# Patient Record
Sex: Male | Born: 1940 | Race: Black or African American | Hispanic: No | Marital: Married | Smoking: Former smoker
Health system: Southern US, Community
[De-identification: ages and names within clinical notes are randomized; demographics above are authoritative.]

## PROBLEM LIST (undated history)

## (undated) DIAGNOSIS — I10 Essential (primary) hypertension: Secondary | ICD-10-CM

## (undated) DIAGNOSIS — M069 Rheumatoid arthritis, unspecified: Secondary | ICD-10-CM

## (undated) DIAGNOSIS — E119 Type 2 diabetes mellitus without complications: Secondary | ICD-10-CM

## (undated) DIAGNOSIS — Q600 Renal agenesis, unilateral: Secondary | ICD-10-CM

## (undated) HISTORY — PX: NO PAST SURGERIES: SHX2092

---

## 2005-08-19 ENCOUNTER — Ambulatory Visit: Payer: Self-pay | Admitting: Unknown Physician Specialty

## 2008-08-09 ENCOUNTER — Emergency Department: Payer: Self-pay | Admitting: Internal Medicine

## 2010-10-08 ENCOUNTER — Ambulatory Visit: Payer: Self-pay | Admitting: Unknown Physician Specialty

## 2010-12-10 ENCOUNTER — Encounter: Payer: Self-pay | Admitting: Orthopedic Surgery

## 2010-12-15 ENCOUNTER — Encounter: Payer: Self-pay | Admitting: Orthopedic Surgery

## 2011-01-15 ENCOUNTER — Encounter: Payer: Self-pay | Admitting: Orthopedic Surgery

## 2011-02-05 ENCOUNTER — Ambulatory Visit: Payer: Self-pay | Admitting: Gastroenterology

## 2011-02-07 LAB — PATHOLOGY REPORT

## 2011-02-15 ENCOUNTER — Encounter: Payer: Self-pay | Admitting: Orthopedic Surgery

## 2011-03-04 ENCOUNTER — Other Ambulatory Visit: Payer: Self-pay | Admitting: Gastroenterology

## 2011-03-04 DIAGNOSIS — Q438 Other specified congenital malformations of intestine: Secondary | ICD-10-CM

## 2011-03-04 DIAGNOSIS — K5730 Diverticulosis of large intestine without perforation or abscess without bleeding: Secondary | ICD-10-CM

## 2011-03-04 DIAGNOSIS — D126 Benign neoplasm of colon, unspecified: Secondary | ICD-10-CM

## 2011-03-12 ENCOUNTER — Inpatient Hospital Stay: Admission: RE | Admit: 2011-03-12 | Payer: Self-pay | Source: Ambulatory Visit

## 2011-03-18 ENCOUNTER — Other Ambulatory Visit: Payer: Self-pay | Admitting: Gastroenterology

## 2011-03-18 ENCOUNTER — Ambulatory Visit
Admission: RE | Admit: 2011-03-18 | Discharge: 2011-03-18 | Disposition: A | Discharge status: Home / Self Care | Payer: Medicare Other | Source: Ambulatory Visit | Attending: Gastroenterology | Admitting: Gastroenterology

## 2011-03-18 DIAGNOSIS — K5730 Diverticulosis of large intestine without perforation or abscess without bleeding: Secondary | ICD-10-CM

## 2011-03-18 DIAGNOSIS — D126 Benign neoplasm of colon, unspecified: Secondary | ICD-10-CM

## 2011-03-18 DIAGNOSIS — Q438 Other specified congenital malformations of intestine: Secondary | ICD-10-CM

## 2013-08-08 IMAGING — CT CT VIRTUAL COLONOSCOPY SCREENING
3 of 11 series · 11 of 36 positions shown, 16 images · non-contrast
Comparison: None

CLINICAL DATA: Diverticulosis

CT VIRTUAL COLONOSCOPY FOR SCREENING
TECHNIQUE: The patient was given a standard low so bowel
preparation with Gastrografin and barium for fluid and stool
tagging respectively.  The quality of the bowel preparation is
moderate to poor.  Automated CO2 insufflation of the colon was
performed prior to image acquisition and colonic distention is
moderate to poor.  Image post processing was used to generate a 3D
endoluminal fly-through projection of the colon and to
electronically subtract stool/fluid as appropriate.

[Series 2: supine (id) · axial · 0.98mm/px · z∈[-420,-153]mm · 3 of 430 slices shown]
[im 108/430  soft-tissue]
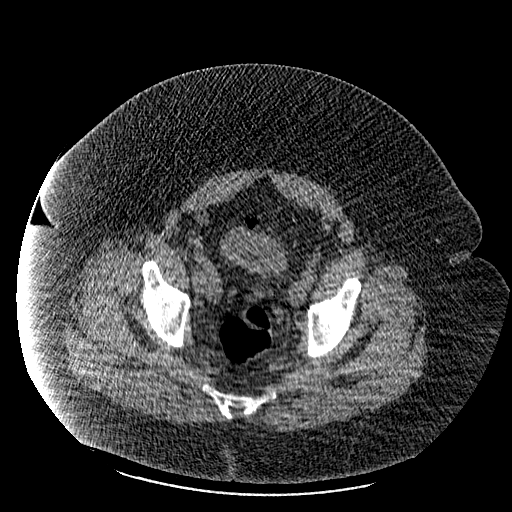
[im 215/430  soft-tissue]
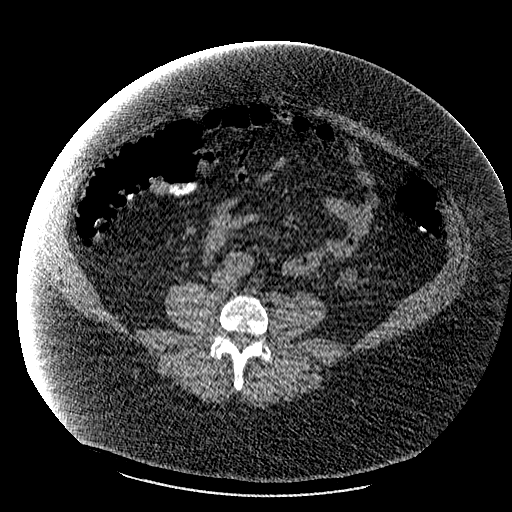
[im 322/430  soft-tissue]
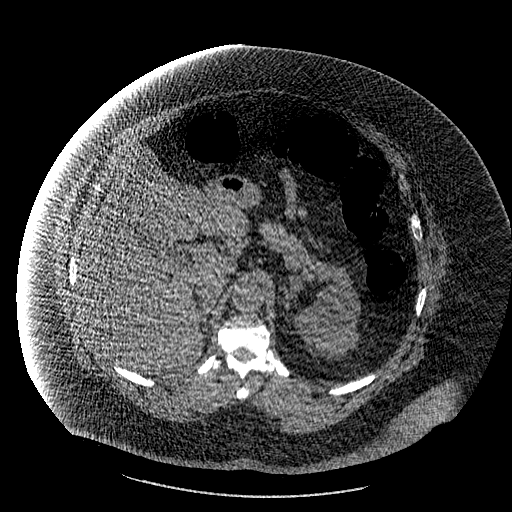

[Series 2: rt decub · axial · 0.98mm/px · z∈[-443,-113]mm · 4 of 441 slices shown, 9 images]
[im 89/441  soft-tissue]
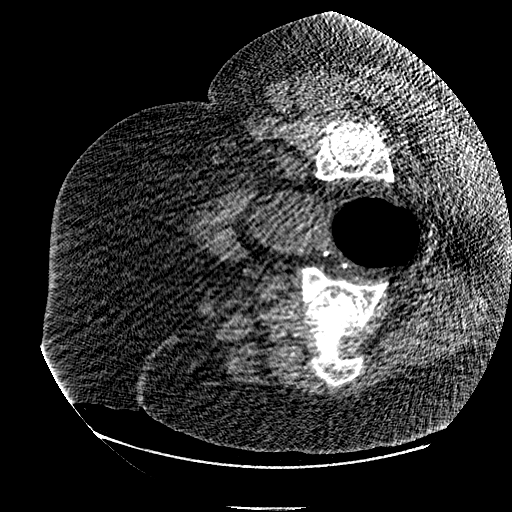
[im 89/441  lung]
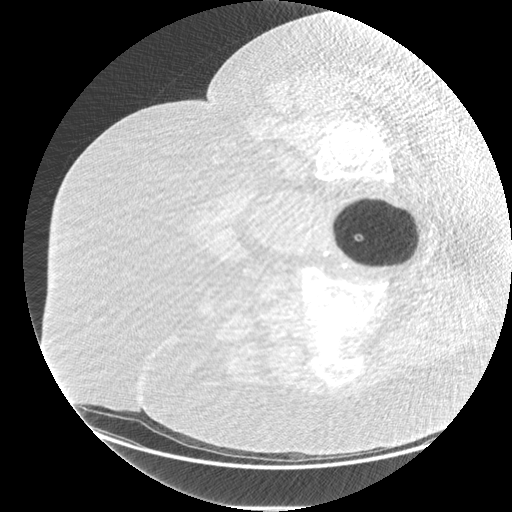
[im 89/441  bone]
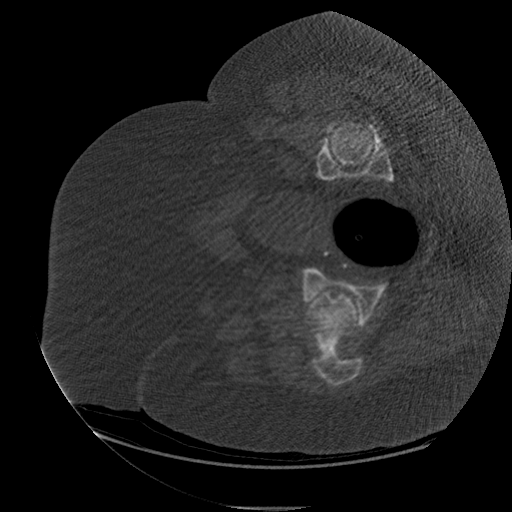
[im 177/441  soft-tissue]
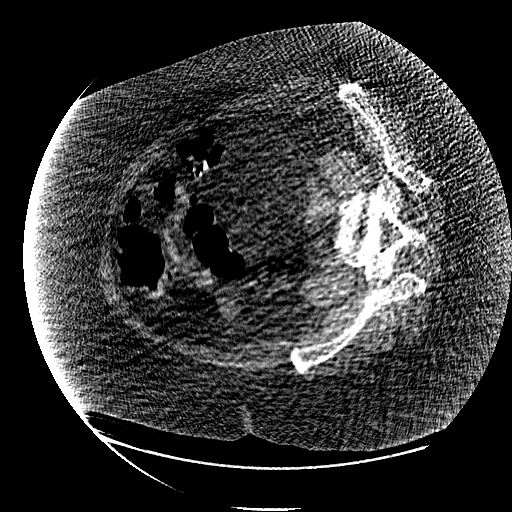
[im 177/441  lung]
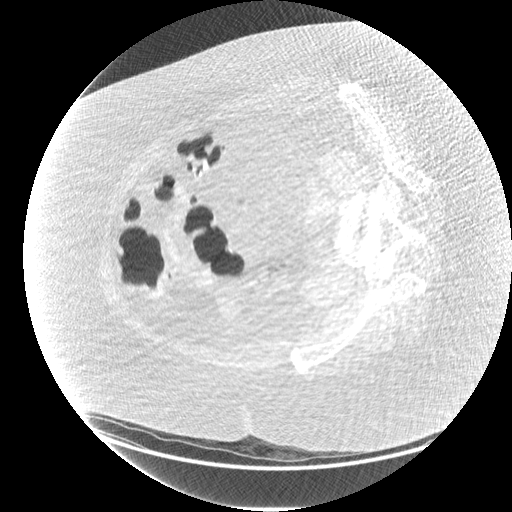
[im 265/441  soft-tissue]
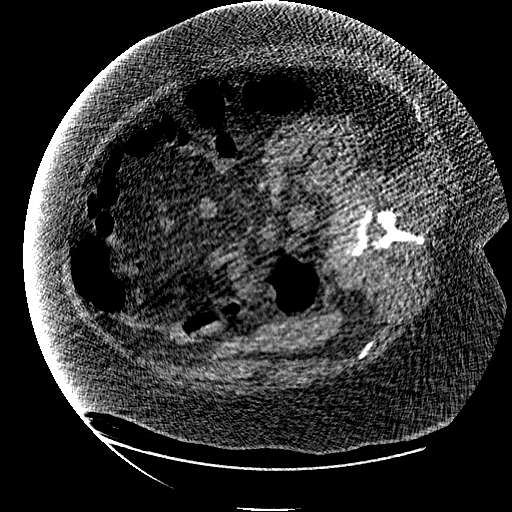
[im 265/441  lung]
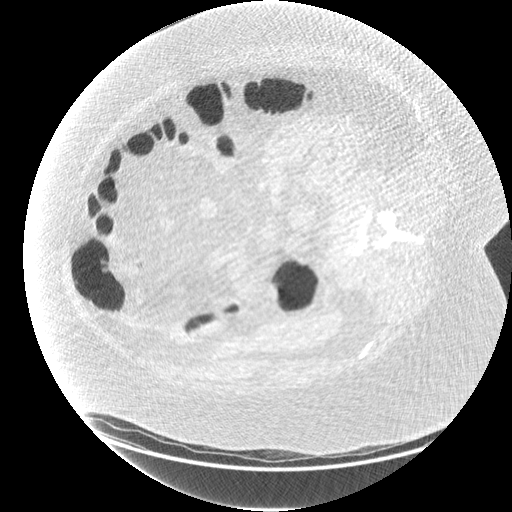
[im 353/441  soft-tissue]
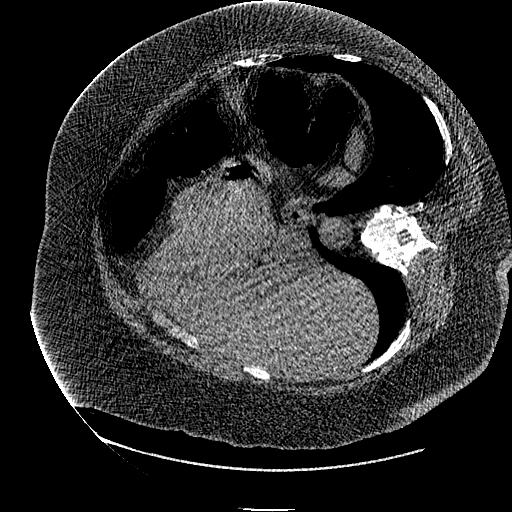
[im 353/441  lung]
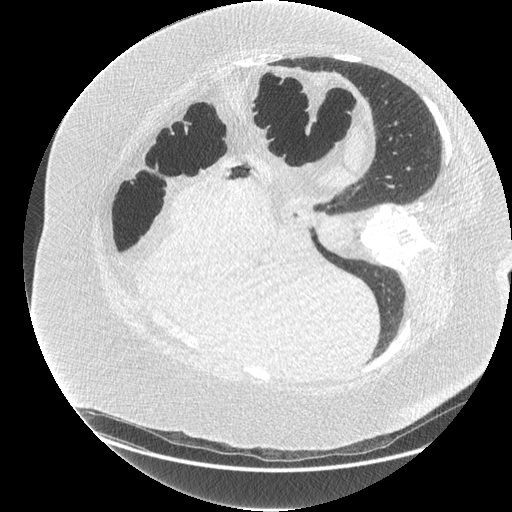

[Series 6: prone (id) · axial · 0.98mm/px · z∈[-470,-132]mm · 4 of 450 slices shown]
[im 90/450  soft-tissue]
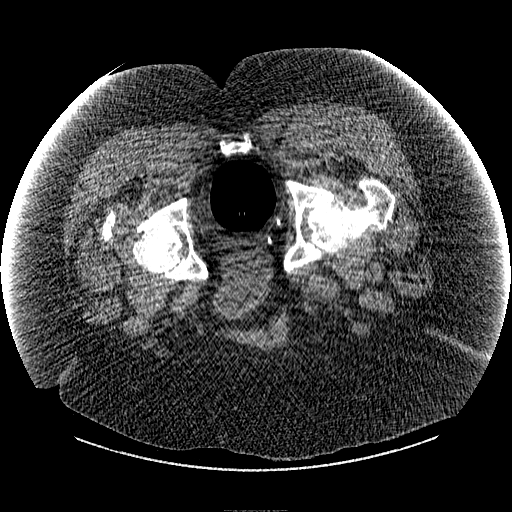
[im 180/450  soft-tissue]
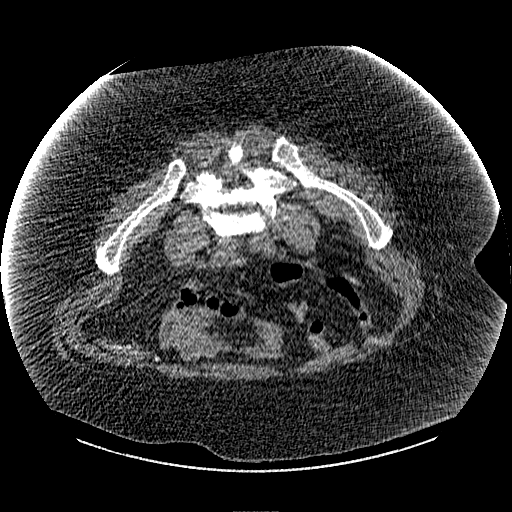
[im 270/450  soft-tissue]
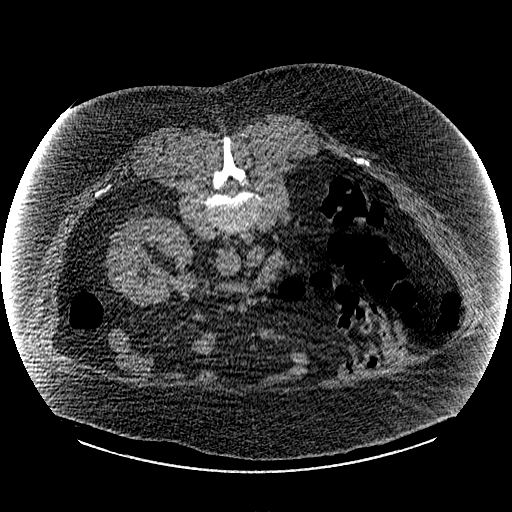
[im 360/450  soft-tissue]
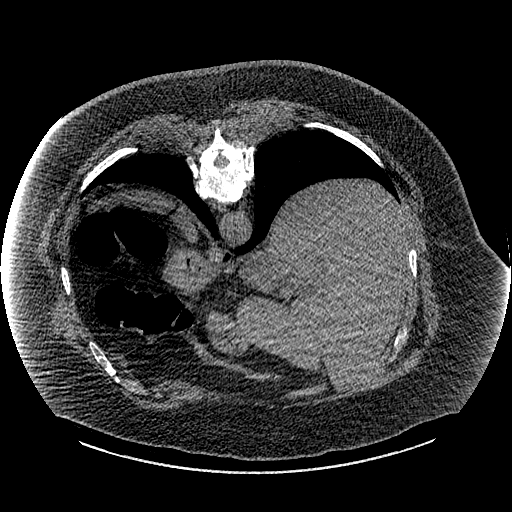

[11 of 36 positions shown; findings below may reference images not displayed]

FINDINGS: Initially the standard supine and prone series were
performed.  However particularly on the prone series there is poor
distention of many areas of the colon.  Therefore an additional
series was performed with the patient in the right lateral
decubitus position.  There is scattered feces and fluid throughout
the colon making assessment more difficult, but no clinically
significant polypoid lesion is identified.  There are areas of the
colon that are extremely difficult to evaluate due to poor
distention particularly the rectosigmoid colon and portions of the
descending colon.  However on both 2-D and 3-D images, no mass is
evident.  Multiple diverticula are present within the rectosigmoid
colon.
IMPRESSION: Suboptimal study due to diverticulosis of the rectosigmoid colon
and incomplete distention of several areas of the colon.  No
clinically significant polypoid lesion or mass is detected however.

Virtual colonoscopy is not designed to detect diminutive polyps
(i.e., less than or equal to 5 mm), the presence or absence of
which may not affect clinical management.

CT ABDOMEN AND PELVIS WITHOUT CONTRAST
FINDINGS: The lung bases are clear.  The liver is unremarkable in
the unenhanced state.  No calcified gallstones are seen.  The
pancreas is normal in size and the pancreatic duct is not dilated.
The adrenal glands are unremarkable.  The left kidney appears
compensatorily hypertrophied with several low attenuation
structures consistent with cysts, but difficult to assess on this
unenhanced study.  The right kidney appears to be markedly
atrophic.  The abdominal aorta is normal in caliber.  No adenopathy
is seen.

Severe diverticulosis of the rectosigmoid colon is noted.  The
urinary bladder is decompressed.  No pelvic mass or fluid is seen.
Degenerative changes are noted particularly throughout the facet
joints of the lower lumbar spine with degenerative disc disease
noted at L4-5 and L5-S1 as well.
IMPRESSION: 1.  Severe diverticulosis of the rectosigmoid colon.
2.  Atrophy of the right kidney with compensatory hypertrophy of
the left kidney.  Probable left renal cysts are noted.
3.  Degenerative change in the lower lumbar spine.

## 2015-02-01 ENCOUNTER — Emergency Department: Payer: Commercial Managed Care - HMO

## 2015-02-01 ENCOUNTER — Inpatient Hospital Stay
Admission: EM | Admit: 2015-02-01 | Discharge: 2015-02-13 | DRG: 871 | Disposition: A | Discharge status: Skilled Nursing Facility | Payer: Commercial Managed Care - HMO | Attending: Internal Medicine | Admitting: Internal Medicine

## 2015-02-01 ENCOUNTER — Encounter: Payer: Self-pay | Admitting: Emergency Medicine

## 2015-02-01 DIAGNOSIS — Z79899 Other long term (current) drug therapy: Secondary | ICD-10-CM | POA: Diagnosis not present

## 2015-02-01 DIAGNOSIS — Z7982 Long term (current) use of aspirin: Secondary | ICD-10-CM | POA: Diagnosis not present

## 2015-02-01 DIAGNOSIS — N183 Chronic kidney disease, stage 3 (moderate): Secondary | ICD-10-CM | POA: Diagnosis present

## 2015-02-01 DIAGNOSIS — Z794 Long term (current) use of insulin: Secondary | ICD-10-CM | POA: Diagnosis not present

## 2015-02-01 DIAGNOSIS — D638 Anemia in other chronic diseases classified elsewhere: Secondary | ICD-10-CM | POA: Diagnosis present

## 2015-02-01 DIAGNOSIS — N186 End stage renal disease: Secondary | ICD-10-CM

## 2015-02-01 DIAGNOSIS — E1122 Type 2 diabetes mellitus with diabetic chronic kidney disease: Secondary | ICD-10-CM | POA: Diagnosis present

## 2015-02-01 DIAGNOSIS — E871 Hypo-osmolality and hyponatremia: Secondary | ICD-10-CM | POA: Diagnosis present

## 2015-02-01 DIAGNOSIS — N170 Acute kidney failure with tubular necrosis: Secondary | ICD-10-CM | POA: Diagnosis present

## 2015-02-01 DIAGNOSIS — M069 Rheumatoid arthritis, unspecified: Secondary | ICD-10-CM | POA: Diagnosis present

## 2015-02-01 DIAGNOSIS — Q600 Renal agenesis, unilateral: Secondary | ICD-10-CM | POA: Diagnosis not present

## 2015-02-01 DIAGNOSIS — A419 Sepsis, unspecified organism: Secondary | ICD-10-CM | POA: Diagnosis present

## 2015-02-01 DIAGNOSIS — R6521 Severe sepsis with septic shock: Secondary | ICD-10-CM | POA: Diagnosis present

## 2015-02-01 DIAGNOSIS — Z6841 Body Mass Index (BMI) 40.0 and over, adult: Secondary | ICD-10-CM | POA: Diagnosis not present

## 2015-02-01 DIAGNOSIS — R7989 Other specified abnormal findings of blood chemistry: Secondary | ICD-10-CM

## 2015-02-01 DIAGNOSIS — I959 Hypotension, unspecified: Secondary | ICD-10-CM | POA: Diagnosis present

## 2015-02-01 DIAGNOSIS — R778 Other specified abnormalities of plasma proteins: Secondary | ICD-10-CM

## 2015-02-01 DIAGNOSIS — R21 Rash and other nonspecific skin eruption: Secondary | ICD-10-CM | POA: Diagnosis not present

## 2015-02-01 DIAGNOSIS — R81 Glycosuria: Secondary | ICD-10-CM | POA: Diagnosis present

## 2015-02-01 DIAGNOSIS — N390 Urinary tract infection, site not specified: Secondary | ICD-10-CM | POA: Diagnosis present

## 2015-02-01 DIAGNOSIS — E872 Acidosis: Secondary | ICD-10-CM | POA: Diagnosis present

## 2015-02-01 DIAGNOSIS — I129 Hypertensive chronic kidney disease with stage 1 through stage 4 chronic kidney disease, or unspecified chronic kidney disease: Secondary | ICD-10-CM | POA: Diagnosis present

## 2015-02-01 DIAGNOSIS — Z833 Family history of diabetes mellitus: Secondary | ICD-10-CM | POA: Diagnosis not present

## 2015-02-01 DIAGNOSIS — Z452 Encounter for adjustment and management of vascular access device: Secondary | ICD-10-CM

## 2015-02-01 DIAGNOSIS — Z87891 Personal history of nicotine dependence: Secondary | ICD-10-CM

## 2015-02-01 DIAGNOSIS — N179 Acute kidney failure, unspecified: Secondary | ICD-10-CM

## 2015-02-01 HISTORY — DX: Rheumatoid arthritis, unspecified: M06.9

## 2015-02-01 HISTORY — DX: Essential (primary) hypertension: I10

## 2015-02-01 HISTORY — DX: Renal agenesis, unilateral: Q60.0

## 2015-02-01 HISTORY — DX: Type 2 diabetes mellitus without complications: E11.9

## 2015-02-01 LAB — MRSA PCR SCREENING: MRSA by PCR: NEGATIVE

## 2015-02-01 LAB — BRAIN NATRIURETIC PEPTIDE: B NATRIURETIC PEPTIDE 5: 223 pg/mL — AB (ref 0.0–100.0)

## 2015-02-01 LAB — LACTIC ACID, PLASMA: Lactic Acid, Venous: 5.2 mmol/L (ref 0.5–2.0)

## 2015-02-01 LAB — BASIC METABOLIC PANEL
ANION GAP: 15 (ref 5–15)
BUN: 35 mg/dL — ABNORMAL HIGH (ref 6–20)
CALCIUM: 8.8 mg/dL — AB (ref 8.9–10.3)
CHLORIDE: 97 mmol/L — AB (ref 101–111)
CO2: 22 mmol/L (ref 22–32)
Creatinine, Ser: 3.64 mg/dL — ABNORMAL HIGH (ref 0.61–1.24)
GFR calc non Af Amer: 15 mL/min — ABNORMAL LOW (ref >60)
GFR, EST AFRICAN AMERICAN: 17 mL/min — AB (ref >60)
GLUCOSE: 251 mg/dL — AB (ref 65–99)
POTASSIUM: 4.6 mmol/L (ref 3.5–5.1)
Sodium: 134 mmol/L — ABNORMAL LOW (ref 135–145)

## 2015-02-01 LAB — CBC WITH DIFFERENTIAL/PLATELET
Basophils Absolute: 0.1 10*3/uL (ref 0–0.1)
Eosinophils Absolute: 0.1 10*3/uL (ref 0–0.7)
Eosinophils Relative: 0 %
HEMATOCRIT: 35.6 % — AB (ref 40.0–52.0)
HEMOGLOBIN: 11.5 g/dL — AB (ref 13.0–18.0)
LYMPHS ABS: 0.6 10*3/uL — AB (ref 1.0–3.6)
MCH: 28.9 pg (ref 26.0–34.0)
MCHC: 32.4 g/dL (ref 32.0–36.0)
MCV: 89 fL (ref 80.0–100.0)
MONO ABS: 0.9 10*3/uL (ref 0.2–1.0)
NEUTROS ABS: 25 10*3/uL — AB (ref 1.4–6.5)
Platelets: 148 10*3/uL — ABNORMAL LOW (ref 150–440)
RBC: 4 MIL/uL — ABNORMAL LOW (ref 4.40–5.90)
RDW: 13.9 % (ref 11.5–14.5)
WBC: 26.8 10*3/uL — ABNORMAL HIGH (ref 3.8–10.6)

## 2015-02-01 LAB — GLUCOSE, CAPILLARY: Glucose-Capillary: 150 mg/dL — ABNORMAL HIGH (ref 65–99)

## 2015-02-01 LAB — TROPONIN I: Troponin I: 0.42 ng/mL — ABNORMAL HIGH (ref <0.031)

## 2015-02-01 MED ORDER — INSULIN ASPART 100 UNIT/ML ~~LOC~~ SOLN
0.0000 [IU] | Freq: Every day | SUBCUTANEOUS | Status: DC
  Administered 2015-02-12: 5 [IU] via SUBCUTANEOUS
  Filled 2015-02-01: qty 4

## 2015-02-01 MED ORDER — INSULIN GLARGINE 100 UNIT/ML ~~LOC~~ SOLN
8.0000 [IU] | Freq: Every day | SUBCUTANEOUS | Status: DC
  Administered 2015-02-02 – 2015-02-03 (×3): 8 [IU] via SUBCUTANEOUS
  Filled 2015-02-01 (×6): qty 0.08

## 2015-02-01 MED ORDER — SODIUM CHLORIDE 0.9 % IV BOLUS (SEPSIS)
1000.0000 mL | INTRAVENOUS | Status: AC
  Administered 2015-02-01: 1000 mL via INTRAVENOUS

## 2015-02-01 MED ORDER — INSULIN ASPART 100 UNIT/ML ~~LOC~~ SOLN
0.0000 [IU] | Freq: Three times a day (TID) | SUBCUTANEOUS | Status: DC
  Administered 2015-02-02: 2 [IU] via SUBCUTANEOUS
  Administered 2015-02-02: 1 [IU] via SUBCUTANEOUS
  Administered 2015-02-02 – 2015-02-03 (×4): 2 [IU] via SUBCUTANEOUS
  Administered 2015-02-04 (×2): 1 [IU] via SUBCUTANEOUS
  Administered 2015-02-04 – 2015-02-05 (×3): 2 [IU] via SUBCUTANEOUS
  Administered 2015-02-06 (×2): 1 [IU] via SUBCUTANEOUS
  Administered 2015-02-06 – 2015-02-07 (×2): 2 [IU] via SUBCUTANEOUS
  Administered 2015-02-07 (×2): 1 [IU] via SUBCUTANEOUS
  Administered 2015-02-08 (×2): 2 [IU] via SUBCUTANEOUS
  Administered 2015-02-08: 1 [IU] via SUBCUTANEOUS
  Administered 2015-02-09: 2 [IU] via SUBCUTANEOUS
  Administered 2015-02-09: 3 [IU] via SUBCUTANEOUS
  Administered 2015-02-10: 1 [IU] via SUBCUTANEOUS
  Administered 2015-02-10: 2 [IU] via SUBCUTANEOUS
  Administered 2015-02-11: 3 [IU] via SUBCUTANEOUS
  Administered 2015-02-11: 2 [IU] via SUBCUTANEOUS
  Administered 2015-02-12: 5 [IU] via SUBCUTANEOUS
  Administered 2015-02-12: 2 [IU] via SUBCUTANEOUS
  Administered 2015-02-12: 3 [IU] via SUBCUTANEOUS
  Administered 2015-02-13 (×2): 2 [IU] via SUBCUTANEOUS
  Filled 2015-02-01: qty 2
  Filled 2015-02-01: qty 5
  Filled 2015-02-01 (×2): qty 2
  Filled 2015-02-01: qty 1
  Filled 2015-02-01: qty 3
  Filled 2015-02-01 (×2): qty 1
  Filled 2015-02-01: qty 3
  Filled 2015-02-01: qty 1
  Filled 2015-02-01 (×4): qty 2
  Filled 2015-02-01: qty 1
  Filled 2015-02-01 (×3): qty 2
  Filled 2015-02-01: qty 1
  Filled 2015-02-01: qty 3
  Filled 2015-02-01 (×3): qty 2
  Filled 2015-02-01: qty 1
  Filled 2015-02-01 (×3): qty 2
  Filled 2015-02-01 (×2): qty 1
  Filled 2015-02-01: qty 2

## 2015-02-01 MED ORDER — ACETAMINOPHEN 325 MG PO TABS
650.0000 mg | ORAL_TABLET | Freq: Four times a day (QID) | ORAL | Status: DC | PRN
Start: 2015-02-01 — End: 2015-02-13

## 2015-02-01 MED ORDER — ASPIRIN 325 MG PO TABS
325.0000 mg | ORAL_TABLET | Freq: Every day | ORAL | Status: DC
  Administered 2015-02-02 – 2015-02-13 (×13): 325 mg via ORAL
  Filled 2015-02-01 (×14): qty 1

## 2015-02-01 MED ORDER — SODIUM CHLORIDE 0.9 % IV SOLN
INTRAVENOUS | Status: DC
  Administered 2015-02-01 – 2015-02-02 (×2): via INTRAVENOUS
  Administered 2015-02-03: 17:00:00 75 mL/h via INTRAVENOUS
  Administered 2015-02-03 – 2015-02-04 (×2): via INTRAVENOUS

## 2015-02-01 MED ORDER — ACETAMINOPHEN 650 MG RE SUPP: 650.0000 mg | Freq: Four times a day (QID) | RECTAL | Status: DC | PRN

## 2015-02-01 MED ORDER — SODIUM CHLORIDE 0.9 % IV BOLUS (SEPSIS)
1000.0000 mL | Freq: Once | INTRAVENOUS | Status: AC
  Administered 2015-02-01: 1000 mL via INTRAVENOUS

## 2015-02-01 MED ORDER — SODIUM CHLORIDE 0.9 % IV BOLUS (SEPSIS): 1000.0000 mL | INTRAVENOUS | Status: AC

## 2015-02-01 MED ORDER — SODIUM CHLORIDE 0.9 % IJ SOLN
3.0000 mL | Freq: Two times a day (BID) | INTRAMUSCULAR | Status: DC
  Administered 2015-02-02 – 2015-02-11 (×14): 3 mL via INTRAVENOUS

## 2015-02-01 MED ORDER — HEPARIN SODIUM (PORCINE) 5000 UNIT/ML IJ SOLN
5000.0000 [IU] | Freq: Three times a day (TID) | INTRAMUSCULAR | Status: DC
  Administered 2015-02-02 – 2015-02-13 (×31): 5000 [IU] via SUBCUTANEOUS
  Filled 2015-02-01 (×32): qty 1

## 2015-02-01 MED ORDER — PIPERACILLIN-TAZOBACTAM 3.375 G IVPB
3.3750 g | Freq: Once | INTRAVENOUS | Status: AC
  Administered 2015-02-01: 3.375 g via INTRAVENOUS
  Filled 2015-02-01: qty 50

## 2015-02-01 NOTE — ED Provider Notes (Addendum)
[facility] Emergency Department Provider Note  ____________________________________________  Time seen: 16:20  I have reviewed the triage vital signs and the nursing notes.  History by:    HISTORY  Chief Complaint Shortness of Breath     HPI Dakota Estes is a 75 y.o. male who reports he began having shortness of breath this morning at 7 AM. He reports feeling cold through most of the night. He had some mild chest pressure tonight but denies any chest pressure or pain currently.     Past Medical History  Diagnosis Date  . Diabetes mellitus without complication ([place])   . Congenital absence of one kidney     There are no active problems to display for this patient.   History reviewed. No pertinent past surgical history.  Current Outpatient Rx  Name  Route  Sig  Dispense  Refill  . aspirin 325 MG tablet   Oral   Take 325 mg by mouth daily.         Marland Kitchen doxazosin (CARDURA) 4 MG tablet   Oral   Take 4 mg by mouth at bedtime.         . insulin lispro (HUMALOG) 100 UNIT/ML injection   Subcutaneous   Inject 40-46 Units into the skin 2 (two) times daily. Pt uses 46 units in the morning and 40 at night.         . insulin NPH Human (HUMULIN N,NOVOLIN N) 100 UNIT/ML injection   Subcutaneous   Inject 60 Units into the skin 2 (two) times daily before a meal.         . ramipril (ALTACE) 10 MG capsule   Oral   Take 10 mg by mouth daily.           Allergies Review of patient's allergies indicates no known allergies.  History reviewed. No pertinent family history.  Social History Social History  Substance Use Topics  . Smoking status: Former Smoker    Types: Cigarettes    Quit date: 02/01/1983  . Smokeless tobacco: None  . Alcohol Use: No    Review of Systems  Constitutional: Positive for chills, feeling cold ENT: Negative for congestion. Cardiovascular: Some chest tightness last night. She history of present illness   Respiratory: Shortness of breath.. Gastrointestinal: Negative for abdominal pain, vomiting and diarrhea. Genitourinary: Negative for dysuria. Musculoskeletal: Chronic edema.. Skin: Negative for rash. Neurological: Negative for headache or focal weakness   10-point ROS otherwise negative.  ____________________________________________   PHYSICAL EXAM:  VITAL SIGNS: ED Triage Vitals  Enc Vitals Group     BP 02/01/15 1419 99/50 mmHg     Pulse Rate 02/01/15 1419 113     Resp 02/01/15 1419 24     Temp 02/01/15 1419 98.6 F (37 C)     Temp Source 02/01/15 1419 Oral     SpO2 02/01/15 1419 96 %     Weight 02/01/15 1419 353 lb (160.12 kg)     Height 02/01/15 1419 6\' 5"  (1.956 m)     Head Cir --      Peak Flow --      Pain Score 02/01/15 1420 0     Pain Loc --      Pain Edu? --      Excl. in [place]? --     Constitutional: Alert and oriented. No distress. ENT   Head: Normocephalic and atraumatic.   Nose: No congestion/rhinnorhea.       Mouth: No erythema, no swelling  Cardiovascular: Normal rate at 96, regular rhythm, no murmur noted Respiratory:  Normal respiratory effort, no tachypnea.    Breath sounds are clear and equal bilaterally.  Gastrointestinal: Soft, no distention. Nontender Back: No muscle spasm, no tenderness, no CVA tenderness. Musculoskeletal: No deformity noted. Nontender with normal range of motion in all extremities. Patient with Neurologic:  Communicative. Normal appearing spontaneous movement in all 4 extremities. No gross focal neurologic deficits are appreciated.  Skin:  Skin is warm, dry. No rash noted. Psychiatric: Mood and affect are normal. Speech and behavior are normal.  ____________________________________________    LABS (pertinent positives/negatives)  Labs Reviewed  BASIC METABOLIC PANEL - Abnormal; Notable for the following:    Sodium 134 (*)    Chloride 97 (*)    Glucose, Bld 251 (*)    BUN 35 (*)    Creatinine, Ser 3.64 (*)     Calcium 8.8 (*)    GFR calc non Af Amer 15 (*)    GFR calc Af Amer 17 (*)    All other components within normal limits  CBC WITH DIFFERENTIAL/PLATELET - Abnormal; Notable for the following:    WBC 26.8 (*)    RBC 4.00 (*)    Hemoglobin 11.5 (*)    HCT 35.6 (*)    Platelets 148 (*)    Neutro Abs 25.0 (*)    Lymphs Abs 0.6 (*)    All other components within normal limits  TROPONIN I - Abnormal; Notable for the following:    Troponin I 0.42 (*)    All other components within normal limits  BRAIN NATRIURETIC PEPTIDE - Abnormal; Notable for the following:    B Natriuretic Peptide 223.0 (*)    All other components within normal limits  LACTIC ACID, PLASMA - Abnormal; Notable for the following:    Lactic Acid, Venous 5.2 (*)    All other components within normal limits  URINE CULTURE  CULTURE, BLOOD (ROUTINE X 2)  CULTURE, BLOOD (ROUTINE X 2)  URINALYSIS COMPLETEWITH MICROSCOPIC ([facility] ONLY)  LACTIC ACID, PLASMA     ____________________________________________   EKG  ED ECG REPORT I, Asante Blanda W, the attending physician, personally viewed and interpreted this ECG.   Date: 02/01/2015  EKG Time: 1425  Rate: 111  Rhythm: Sinus tachycardia  Axis: Normal  Intervals: Normal  ST&T Change: None noted   ____________________________________________    RADIOLOGY  Chest x-ray  FINDINGS: Studies limb about the patient's size. The lungs appear clear. Heart size is normal. No pneumothorax or pleural effusion.  IMPRESSION: No acute disease.  ____________________________________________   PROCEDURES  Code sepsis  CRITICAL CARE Performed by: Ahmed Prima   Total critical care time: 30 minutes  Critical care time was exclusive of separately billable procedures and treating other patients.  Critical care was necessary to treat or prevent imminent or life-threatening deterioration.  Critical care was time spent personally by me on the following activities:  development of treatment plan with patient and/or surrogate as well as nursing, discussions with consultants, evaluation of patient's response to treatment, examination of patient, obtaining history from patient or surrogate, ordering and performing treatments and interventions, ordering and review of laboratory studies, ordering and review of radiographic studies, pulse oximetry and re-evaluation of patient's condition.   ____________________________________________   INITIAL IMPRESSION / ASSESSMENT AND PLAN / ED COURSE  Pertinent labs & imaging results that were available during my care of the patient were reviewed by me and considered in my medical decision making (see chart for details).  75 year old male with some shortness of breath. He reports he has some chills. He slightly tachycardic and he has borderline blood pressure. We will order basic labs as well as a lactic acid. Chest x-ray is pending.  ----------------------------------------- 4:22 PM on 02/01/2015 -----------------------------------------  Chest x-ray is unremarkable. No acute change  ----------------------------------------- 6:22 PM on 02/01/2015 -----------------------------------------  I just been informed that the lactic acid level is 5.2. We will add blood cultures and begin antibiotics, though it is unclear what may be a source for infection.  ----------------------------------------- 6:48 PM on 02/01/2015 -----------------------------------------  Reexamination patient finds him alert and communicative and in no acute distress. He does report having some additional chills. His white blood cell count is 26.8. His renal function is diminished with a BUN of 35 and creatinine of 3.64.  This renal dysfunction is new compared to prior results.  I initiated a code sepsis for the patient. His receiving additional IV fluids. I discussed admission with Dr. Volanda Napoleon of the hospitalist  service.  ----------------------------------------- 7:24 PM on 02/01/2015 -----------------------------------------  Troponin is elevated 0.42  ____________________________________________   FINAL CLINICAL IMPRESSION(S) / ED DIAGNOSES  Final diagnoses:  Sepsis, due to unspecified organism [facility])  Acute renal failure, unspecified acute renal failure type ([place])  Troponin level elevated      Ahmed Prima, MD 02/01/15 [id number]  Ahmed Prima, MD 02/01/15 [phone]

## 2015-02-01 NOTE — ED Notes (Addendum)
States SOB that began this AM, states not feeling well since Sunday, brother states bloody urine, pt denies any pain

## 2015-02-01 NOTE — H&P (Signed)
[place] [facility] [place] NAME: Dakota Estes    MR#:  [id number]  DATE OF BIRTH:  26-Jan-1940  DATE OF ADMISSION:  02/01/2015  PRIMARY CARE PHYSICIAN: Dr. Mickel Duhamel  REQUESTING/REFERRING PHYSICIAN: Thomasene Lot  CHIEF COMPLAINT:   Chief Complaint  Patient presents with  . Shortness of Breath    HISTORY OF PRESENT ILLNESS:  Dakota Estes  is a 75 y.o. male presents with a shaking chill. This lasted 2 hours last night. He's been feeling very weak. No appetite and not eating for a couple days. 2 days ago he had an abdominal pain that lasted a little while and then went away no abdominal pain currently today. He came to the ER was found to have an elevated white count of 26.8, high lactic acid of 5.2, and acute renal failure and he was also hypotensive. Hospitalist services were contacted for further evaluation.  PAST MEDICAL HISTORY:   Past Medical History  Diagnosis Date  . Diabetes mellitus without complication ([place])   . Congenital absence of one kidney   . Hypertension   . Rheumatoid arthritis ([place])     PAST SURGICAL HISTORY:   Past Surgical History  Procedure Laterality Date  . No past surgeries      SOCIAL HISTORY:   Social History  Substance Use Topics  . Smoking status: Former Smoker    Types: Cigarettes    Quit date: 02/01/1983  . Smokeless tobacco: Not on file  . Alcohol Use: No    FAMILY HISTORY:   Family History  Problem Relation Age of Onset  . Dementia Mother   . Diabetes Father     DRUG ALLERGIES:  No Known Allergies  REVIEW OF SYSTEMS:  CONSTITUTIONAL: No fever, cold and shaking chill. Positive for weakness. Positive for weight loss 30 years. EYES: No blurred or double vision. Wears glasses. EARS, NOSE, AND THROAT: No tinnitus or ear pain. No sore throat RESPIRATORY: No cough, positive for shortness of breath shortness of breath, wheezing or hemoptysis.  CARDIOVASCULAR: No chest pain,  orthopnea, edema.  GASTROINTESTINAL: No nausea, vomiting, diarrhea. 2 days ago had some abdominal pain. No blood in bowel movements. GENITOURINARY: No dysuria, dark urine possible blood ENDOCRINE: No polyuria, nocturia,  HEMATOLOGY: No anemia, easy bruising or bleeding SKIN: No rash or lesion. MUSCULOSKELETAL: No joint pain or arthritis.   NEUROLOGIC: No tingling, numbness, weakness.  PSYCHIATRY: No anxiety or depression.   MEDICATIONS AT HOME:   Prior to Admission medications   Medication Sig Start Date End Date Taking? Authorizing Provider  aspirin 325 MG tablet Take 325 mg by mouth daily.   Yes Historical Provider, MD  doxazosin (CARDURA) 4 MG tablet Take 4 mg by mouth at bedtime.   Yes Historical Provider, MD  insulin lispro (HUMALOG) 100 UNIT/ML injection Inject 40-46 Units into the skin 2 (two) times daily. Pt uses 46 units in the morning and 40 at night.   Yes Historical Provider, MD  insulin NPH Human (HUMULIN N,NOVOLIN N) 100 UNIT/ML injection Inject 60 Units into the skin 2 (two) times daily before a meal.   Yes Historical Provider, MD  ramipril (ALTACE) 10 MG capsule Take 10 mg by mouth daily.   Yes Historical Provider, MD      VITAL SIGNS:  Blood pressure 111/56, pulse 103 respirations 24 pulse ox 95% on room air  PHYSICAL EXAMINATION:  GENERAL:  75 y.o.-year-old patient lying in the bed with no acute distress.  EYES: Pupils equal,  round, reactive to light and accommodation. No scleral icterus. Extraocular muscles intact.  HEENT: Head atraumatic, normocephalic. Oropharynx and nasopharynx clear.  NECK:  Supple, no jugular venous distention. No thyroid enlargement, no tenderness.  LUNGS: Normal breath sounds bilaterally, no wheezing, rales,rhonchi or crepitation. No use of accessory muscles of respiration.  CARDIOVASCULAR: S1, S2 tachycardic. No murmurs, rubs, or gallops.  ABDOMEN: Soft, nontender, nondistended. Bowel sounds present. No organomegaly or mass.  EXTREMITIES:  3+ edema, no cyanosis, or clubbing.  NEUROLOGIC: Cranial nerves II through XII are intact. Sensation intact. Gait not checked.  PSYCHIATRIC: The patient is alert and oriented x 3.  SKIN: No rash, lesion, or ulcer.   LABORATORY PANEL:   CBC  Recent Labs Lab 02/01/15 1709  WBC 26.8*  HGB 11.5*  HCT 35.6*  PLT 148*   ------------------------------------------------------------------------------------------------------------------  Chemistries   Recent Labs Lab 02/01/15 1709  NA 134*  K 4.6  CL 97*  CO2 22  GLUCOSE 251*  BUN 35*  CREATININE 3.64*  CALCIUM 8.8*   ------------------------------------------------------------------------------------------------------------------  Cardiac Enzymes  Recent Labs Lab 02/01/15 1709  TROPONINI 0.42*   ------------------------------------------------------------------------------------------------------------------  RADIOLOGY:  Dg Chest 2 View  02/01/2015  CLINICAL DATA:  Worsening shortness of breath and abdominal pain since 01/29/2015. Initial encounter. EXAM: CHEST  2 VIEW COMPARISON:  CT chest 10/08/2010. FINDINGS: Studies limb about the patient's size. The lungs appear clear. Heart size is normal. No pneumothorax or pleural effusion. IMPRESSION: No acute disease. Electronically Signed   By: Inge Rise M.D.   On: 02/01/2015 15:14    EKG:   Sinus tachycardia 111 bpm  IMPRESSION AND PLAN:   1. Severe sepsis, lactic acidosis, hypotension, tachycardia, leukocytosis and suspected infection. Empiric Zosyn. Follow-up blood cultures. When the patient urinates get a urine analysis and culture. 2. Acute renal failure with one kidney. IV fluid hydration. Likely will need a nephrology consultation. 3. Type 2 diabetes mellitus- will give low-dose Lantus and sliding scale 4. Hypotension- give IV fluid hydration and hold Altace 5. Rheumatoid arthritis 6. Morbid obesity  All the records are reviewed and case discussed with ED  provider. Management plans discussed with the patient, family and they are in agreement.  CODE STATUS: Full code  TOTAL TIME TAKING CARE OF THIS PATIENT: 50 minutes, critical care time.    Loletha Grayer M.D on 02/01/2015 at 8:03 PM  Between 7am to 6pm - Pager - [phone]  After 6pm call admission pager [facility]  Office  [phone]  CC: Primary care physician; Mickel Duhamel

## 2015-02-01 NOTE — ED Notes (Deleted)
States SOB that began today, states since Sunday he has not been feeling well, pt breathing heavy during triage

## 2015-02-01 NOTE — ED Notes (Signed)
MD at bedside. 

## 2015-02-01 NOTE — Progress Notes (Signed)
[facility] Progress Note Patient Name: Dakota Estes DOB: 04-07-1940 MRN: [id number]   Date of Service  02/01/2015  HPI/Events of Note  Sepsis -lactate 5.2 Source ? UTI AKI  eICU Interventions  Sepsis repeat assessment completed Needs rpt lactate     Intervention Category Evaluation Type: New Patient Evaluation  Dakota Virgo V. 02/01/2015, 9:53 PM

## 2015-02-02 ENCOUNTER — Inpatient Hospital Stay: Payer: Commercial Managed Care - HMO

## 2015-02-02 LAB — URINALYSIS COMPLETE WITH MICROSCOPIC (ARMC ONLY)
GLUCOSE, UA: 50 mg/dL — AB
KETONES UR: NEGATIVE mg/dL
Nitrite: NEGATIVE
Protein, ur: 100 mg/dL — AB
SPECIFIC GRAVITY, URINE: 1.018 (ref 1.005–1.030)
pH: 5 (ref 5.0–8.0)

## 2015-02-02 LAB — LACTIC ACID, PLASMA
LACTIC ACID, VENOUS: 1.6 mmol/L (ref 0.5–2.0)
Lactic Acid, Venous: 2 mmol/L (ref 0.5–2.0)

## 2015-02-02 LAB — BASIC METABOLIC PANEL
Anion gap: 12 (ref 5–15)
BUN: 38 mg/dL — AB (ref 6–20)
CALCIUM: 8.1 mg/dL — AB (ref 8.9–10.3)
CHLORIDE: 102 mmol/L (ref 101–111)
CO2: 24 mmol/L (ref 22–32)
CREATININE: 3.84 mg/dL — AB (ref 0.61–1.24)
GFR, EST AFRICAN AMERICAN: 16 mL/min — AB (ref >60)
GFR, EST NON AFRICAN AMERICAN: 14 mL/min — AB (ref >60)
Glucose, Bld: 86 mg/dL (ref 65–99)
Potassium: 3.9 mmol/L (ref 3.5–5.1)
SODIUM: 138 mmol/L (ref 135–145)

## 2015-02-02 LAB — GLUCOSE, CAPILLARY
GLUCOSE-CAPILLARY: 130 mg/dL — AB (ref 65–99)
GLUCOSE-CAPILLARY: 169 mg/dL — AB (ref 65–99)
GLUCOSE-CAPILLARY: 153 mg/dL — AB (ref 65–99)
Glucose-Capillary: 161 mg/dL — ABNORMAL HIGH (ref 65–99)

## 2015-02-02 LAB — CBC
HCT: 34.1 % — ABNORMAL LOW (ref 40.0–52.0)
Hemoglobin: 11.1 g/dL — ABNORMAL LOW (ref 13.0–18.0)
MCH: 29.2 pg (ref 26.0–34.0)
MCHC: 32.4 g/dL (ref 32.0–36.0)
MCV: 90.1 fL (ref 80.0–100.0)
PLATELETS: 128 10*3/uL — AB (ref 150–440)
RBC: 3.79 MIL/uL — AB (ref 4.40–5.90)
RDW: 14.1 % (ref 11.5–14.5)
WBC: 19.5 10*3/uL — AB (ref 3.8–10.6)

## 2015-02-02 LAB — IRON AND TIBC
IRON: 21 ug/dL — AB (ref 45–182)
Saturation Ratios: 8 % — ABNORMAL LOW (ref 17.9–39.5)
TIBC: 267 ug/dL (ref 250–450)
UIBC: 246 ug/dL

## 2015-02-02 LAB — TROPONIN I: Troponin I: 0.52 ng/mL — ABNORMAL HIGH (ref <0.031)

## 2015-02-02 LAB — FERRITIN: FERRITIN: 711 ng/mL — AB (ref 24–336)

## 2015-02-02 MED ORDER — PIPERACILLIN-TAZOBACTAM 4.5 G IVPB
4.5000 g | Freq: Three times a day (TID) | INTRAVENOUS | Status: DC
  Administered 2015-02-02 – 2015-02-03 (×3): 4.5 g via INTRAVENOUS
  Filled 2015-02-02 (×6): qty 100

## 2015-02-02 NOTE — Plan of Care (Signed)
Per report from ICU - Pt Urinalysis and urine culture sample has already been sent to lab.

## 2015-02-02 NOTE — Progress Notes (Signed)
Pt tx 1C per MD order, pt tol well, pt verbalized understanding of tx, report called to receiving Rn, all questions answered

## 2015-02-02 NOTE — Progress Notes (Signed)
ANTIBIOTIC CONSULT NOTE - INITIAL  Pharmacy Consult for Zosyn Indication: rule out sepsis  No Known Allergies  Patient Measurements: Height: 6\' 5"  (195.6 cm) Weight: (!) 431 lb 14.1 oz (195.9 kg) IBW/kg (Calculated) : 89.1   Vital Signs: Temp: 98.9 F (37.2 C) (01/19 0810) Temp Source: Oral (01/19 0810) BP: 109/64 mmHg (01/19 0700) Pulse Rate: 96 (01/19 0700) Intake/Output from previous day: 01/18 0701 - 01/19 0700 In: 1452.9 [P.O.:480; I.V.:972.9] Out: 200 [Urine:200] Intake/Output from this shift: Total I/O In: 960 [P.O.:360; I.V.:500; IV Piggyback:100] Out: 800 [Urine:800]  Labs:  Recent Labs  02/01/15 1709 02/02/15 0211  WBC 26.8* 19.5*  HGB 11.5* 11.1*  PLT 148* 128*  CREATININE 3.64* 3.84*   Estimated Creatinine Clearance: 31 mL/min (by C-G formula based on Cr of 3.84). No results for input(s): VANCOTROUGH, VANCOPEAK, VANCORANDOM, GENTTROUGH, GENTPEAK, GENTRANDOM, TOBRATROUGH, TOBRAPEAK, TOBRARND, AMIKACINPEAK, AMIKACINTROU, AMIKACIN in the last 72 hours.   Microbiology: Recent Results (from the past 720 hour(s))  MRSA PCR Screening     Status: None   Collection Time: 02/01/15  9:37 PM  Result Value Ref Range Status   MRSA by PCR NEGATIVE NEGATIVE Final    Comment:        The GeneXpert MRSA Assay (FDA approved for NASAL specimens only), is one component of a comprehensive MRSA colonization surveillance program. It is not intended to diagnose MRSA infection nor to guide or monitor treatment for MRSA infections.     Medical History: Past Medical History  Diagnosis Date  . Diabetes mellitus without complication ([place])   . Congenital absence of one kidney   . Hypertension   . Rheumatoid arthritis ([facility])     Medications:  Scheduled:  . aspirin  325 mg Oral Daily  . heparin  5,000 Units Subcutaneous 3 times per day  . insulin aspart  0-5 Units Subcutaneous QHS  . insulin aspart  0-9 Units Subcutaneous TID WC  . insulin glargine  8 Units  Subcutaneous QHS  . piperacillin-tazobactam  4.5 g Intravenous Q8H  . sodium chloride  3 mL Intravenous Q12H   Infusions:  . sodium chloride 125 mL/hr at 02/02/15 0700   Assessment: 75 y/o M admitted with severe sepsis and ARF.   Plan:  Zosyn 3.375 g iv once given in ED. Will continue Zosyn 4.5 g EI q 8 hours due to weight > 120 kg. Will continue to follow renal function and culture results.   Ulice Dash D 02/02/2015,10:41 AM

## 2015-02-02 NOTE — Progress Notes (Signed)
[place] [facility] NAME: Dakota Estes    MR#:  [id number]  DATE OF BIRTH:  10-12-1940  SUBJECTIVE:  CHIEF COMPLAINT:   Chief Complaint  Patient presents with  . Shortness of Breath   no shortness of breath, nausea or vomiting.  REVIEW OF SYSTEMS:  CONSTITUTIONAL: No fever, but has weakness.  EYES: No blurred or double vision.  EARS, NOSE, AND THROAT: No tinnitus or ear pain.  RESPIRATORY: No cough, shortness of breath, wheezing or hemoptysis.  CARDIOVASCULAR: No chest pain, orthopnea, edema.  GASTROINTESTINAL: No nausea, vomiting, diarrhea or abdominal pain.  GENITOURINARY: No dysuria, hematuria.  ENDOCRINE: No polyuria, nocturia,  HEMATOLOGY: No anemia, easy bruising or bleeding SKIN: No rash or lesion. MUSCULOSKELETAL: No joint pain or arthritis.   NEUROLOGIC: No tingling, numbness, weakness.  PSYCHIATRY: No anxiety or depression.   DRUG ALLERGIES:  No Known Allergies  VITALS:  Blood pressure 103/47, pulse 95, temperature 98.2 F (36.8 C), temperature source Oral, resp. rate 20, height 6\' 5"  (1.956 m), weight 195.9 kg (431 lb 14.1 oz), SpO2 98 %.  PHYSICAL EXAMINATION:  GENERAL:  75 y.o.-year-old patient lying in the bed with no acute distress. Morbidly obese. EYES: Pupils equal, round, reactive to light and accommodation. No scleral icterus. Extraocular muscles intact.  HEENT: Head atraumatic, normocephalic. Oropharynx and nasopharynx clear.  NECK:  Supple, no jugular venous distention. No thyroid enlargement, no tenderness.  LUNGS: Normal breath sounds bilaterally, no wheezing, rales,rhonchi or crepitation. No use of accessory muscles of respiration.  CARDIOVASCULAR: S1, S2 normal. No murmurs, rubs, or gallops.  ABDOMEN: Soft, nontender, nondistended. Bowel sounds present. No organomegaly or mass.  EXTREMITIES: No pedal edema, cyanosis, or clubbing.  NEUROLOGIC: Cranial nerves II through XII are intact. Muscle  strength 5/5 in all extremities. Sensation intact. Gait not checked.  PSYCHIATRIC: The patient is alert and oriented x 3.  SKIN: No obvious rash, lesion, or ulcer.    LABORATORY PANEL:   CBC  Recent Labs Lab 02/02/15 0211  WBC 19.5*  HGB 11.1*  HCT 34.1*  PLT 128*   ------------------------------------------------------------------------------------------------------------------  Chemistries   Recent Labs Lab 02/02/15 0211  NA 138  K 3.9  CL 102  CO2 24  GLUCOSE 86  BUN 38*  CREATININE 3.84*  CALCIUM 8.1*   ------------------------------------------------------------------------------------------------------------------  Cardiac Enzymes  Recent Labs Lab 02/01/15 1709  TROPONINI 0.42*   ------------------------------------------------------------------------------------------------------------------  RADIOLOGY:  Dg Chest 2 View  02/01/2015  CLINICAL DATA:  Worsening shortness of breath and abdominal pain since 01/29/2015. Initial encounter. EXAM: CHEST  2 VIEW COMPARISON:  CT chest 10/08/2010. FINDINGS: Studies limb about the patient's size. The lungs appear clear. Heart size is normal. No pneumothorax or pleural effusion. IMPRESSION: No acute disease. Electronically Signed   By: Inge Rise M.D.   On: 02/01/2015 15:14   US Renal  02/02/2015  CLINICAL DATA:  Acute kidney injury. 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 EXAM: RENAL / URINARY TRACT ULTRASOUND COMPLETE COMPARISON:  03/18/2011 CT FINDINGS: Right Kidney: Length: Not visualized. Diminutive right kidney identified by previous CT is not visualized today. Left Kidney: Length: 16.8 cm. Echogenicity is normal. No hydronephrosis identified. Upper pole hypoechoic mass is 5.0 x 6.2 x 4.7 cm. Findings are consistent with benign, stable cyst compared with previous CT exam. Bladder: Bladder is decompressed by a Foley catheter, limiting evaluation of the bladder. Additional: Study quality is degraded by  patient body habitus. IMPRESSION: 1. Nonvisualized right kidney, known  to be diminutive based on prior CT exam. 2. No hydronephrosis on the left. 3. Upper pole left renal cyst. Electronically Signed   By: Nolon Nations M.D.   On: 02/02/2015 09:30    EKG:   Orders placed or performed during the hospital encounter of 02/01/15  . ED EKG  . ED EKG  . EKG 12-Lead  . EKG 12-Lead    ASSESSMENT AND PLAN:   1. Septic shock. Improved with normal saline bolus 4 L. hold Altace, continue NS IV.   2. Severe sepsis with UTI.  continue Zosyn, follow-up CBC, urine culture and blood culture.   3.  lactic acidosis, improved with IV fluid support and antibiotics.  4. Acute renal failure with one kidney. Ultrasound was negative for hydronephrosis. continue normal saline iv hydration. follow-up BMP. No dialysis indication at this time per Dr. Holley Raring.  hold Altace  5. Elevated troponin, possible due to septic shock and acute renal failure. Follow-up troponin. Continue aspirin and a follow-up lipid panel.  6. Type 2 diabetes mellitus.  Controlled, continue low-dose Lantus and sliding scale  7. Morbid obesity.    All the records are reviewed and case discussed with Care Management/Social Workerr. Management plans discussed with the patient, family and they are in agreement.  CODE STATUS: Full code  TOTAL TIME TAKING CARE OF THIS PATIENT: 41 minutes.  Greater than 50% time was spent on coordination of care and face-to-face counseling.  POSSIBLE D/C IN 3 DAYS, DEPENDING ON CLINICAL CONDITION.   Demetrios Loll M.D on 02/02/2015 at 3:35 PM  Between 7am to 6pm - Pager - [phone]  After 6pm go to [email or web address] - password [email or web address] [facility]  [facility]  Office  [phone]  CC: Primary care physician; No primary care provider on file.

## 2015-02-02 NOTE — Progress Notes (Signed)
Notified MD at [place] of urine output.  Per MD continue to monitor. MD ordered renal ultrasound.

## 2015-02-02 NOTE — Consult Note (Signed)
[facility] CONSULT NOTE    Date: 02/02/2015                  Patient Name:  Dakota Estes  MRN: [id number]  DOB: 1940-06-25  Age / Sex: 75 y.o., male         PCP: No primary care provider on file.                 Service Requesting Consult: Dr. Earleen Newport                 Reason for Consult:             History of Present Illness: Patient is a 75 y.o. male with a PMHx of diabetes mellitus, hypertension, rheumatoid arthritis, congenitally atrophic right kidney who was admitted to [facility] on 02/01/2015 for evaluation of shortness of breath and lower abdominal pain. Patient states that earlier in the week he developed chills and feeling warm. He subsequent he developed lower abdominal pain and reports cloudy and dark urine.  He presented for the symptoms to the emergency department. He was found to have multiple metabolic derangements. Upon presentation Bielen was 70 with a creatinine of 3.6. His baseline creatinine is 1.1 on 05/16/14. He was found to have an elevated lactic acid level of 5.2. He was initially admitted to the critical care unit but now has been transitioned to floor care. Renal ultrasound was performed and showed left kidney without hydronephrosis. There was a left upper pole cyst which was consistent with a simple cyst.  Urinalysis showed too numerous to count RBCs and too numerous to count WBCs. This is consistent with severe urinary tract infection.   Medications: Outpatient medications: Prescriptions prior to admission  Medication Sig Dispense Refill Last Dose  . aspirin 325 MG tablet Take 325 mg by mouth daily.   unknown at unknown   . doxazosin (CARDURA) 4 MG tablet Take 4 mg by mouth at bedtime.   unknown at unknown   . insulin lispro (HUMALOG) 100 UNIT/ML injection Inject 40-46 Units into the skin 2 (two) times daily. Pt uses 46 units in the morning and 40 at night.   unknown at unknown   . insulin NPH Human (HUMULIN N,NOVOLIN N) 100 UNIT/ML  injection Inject 60 Units into the skin 2 (two) times daily before a meal.   unknown at unknown   . ramipril (ALTACE) 10 MG capsule Take 10 mg by mouth daily.   unknown at unknown     Current medications: Current Facility-Administered Medications  Medication Dose Route Frequency Provider Last Rate Last Dose  . 0.9 %  sodium chloride infusion   Intravenous Continuous Loletha Grayer, MD 125 mL/hr at 02/02/15 0700    . acetaminophen (TYLENOL) tablet 650 mg  650 mg Oral Q6H PRN Loletha Grayer, MD       Or  . acetaminophen (TYLENOL) suppository 650 mg  650 mg Rectal Q6H PRN Loletha Grayer, MD      . aspirin tablet 325 mg  325 mg Oral Daily Loletha Grayer, MD   325 mg at 02/02/15 1059  . heparin injection 5,000 Units  5,000 Units Subcutaneous 3 times per day Loletha Grayer, MD   5,000 Units at 02/02/15 0535  . insulin aspart (novoLOG) injection 0-5 Units  0-5 Units Subcutaneous QHS Loletha Grayer, MD   0 Units at 02/02/15 0018  . insulin aspart (novoLOG) injection 0-9 Units  0-9 Units Subcutaneous TID WC Loletha Grayer, MD   2  Units at 02/02/15 1254  . insulin glargine (LANTUS) injection 8 Units  8 Units Subcutaneous QHS Loletha Grayer, MD   8 Units at 02/02/15 0025  . piperacillin-tazobactam (ZOSYN) IVPB 4.5 g  4.5 g Intravenous Q8H Demetrios Loll, MD   4.5 g at 02/02/15 0905  . sodium chloride 0.9 % injection 3 mL  3 mL Intravenous Q12H Loletha Grayer, MD   3 mL at 02/02/15 1000      Allergies: No Known Allergies    Past Medical History: Past Medical History  Diagnosis Date  . Diabetes mellitus without complication ([place])   . Congenital absence of one kidney   . Hypertension   . Rheumatoid arthritis [facility])      Past Surgical History: Past Surgical History  Procedure Laterality Date  . No past surgeries       Family History: Family History  Problem Relation Age of Onset  . Dementia Mother   . Diabetes Father      Social History: Social History   Social History  .  Marital Status: Married    Spouse Name: N/A  . Number of Children: N/A  . Years of Education: N/A   Occupational History  . Not on file.   Social History Main Topics  . Smoking status: Former Smoker    Types: Cigarettes    Quit date: 02/01/1983  . Smokeless tobacco: Not on file  . Alcohol Use: No  . Drug Use: No  . Sexual Activity: Not on file   Other Topics Concern  . Not on file   Social History Narrative     Review of Systems: Review of Systems  Constitutional: Positive for fever, chills and malaise/fatigue. Negative for weight loss.  HENT: Negative for ear discharge and nosebleeds.   Eyes: Negative for blurred vision, double vision and discharge.  Respiratory: Positive for shortness of breath. Negative for cough and hemoptysis.   Cardiovascular: Positive for orthopnea. Negative for chest pain and palpitations.  Gastrointestinal: Positive for nausea and abdominal pain. Negative for heartburn and vomiting.  Genitourinary: Positive for dysuria and hematuria. Negative for urgency and frequency.  Musculoskeletal: Positive for back pain. Negative for myalgias and neck pain.  Skin: Negative for itching and rash.  Neurological: Negative for dizziness, focal weakness and headaches.  Endo/Heme/Allergies: Negative for polydipsia. Does not bruise/bleed easily.  Psychiatric/Behavioral: Negative for depression. The patient is not nervous/anxious.      Vital Signs: Blood pressure 109/64, pulse 96, temperature 98.9 F (37.2 C), temperature source Oral, resp. rate 21, height 6\' 5"  (1.956 m), weight 195.9 kg (431 lb 14.1 oz), SpO2 96 %.  Weight trends: Filed Weights   02/01/15 1419 02/01/15 2132  Weight: 160.12 kg (353 lb) 195.9 kg (431 lb 14.1 oz)    Physical Exam: General: NAD, obese  Head: Normocephalic, atraumatic.  Eyes: Anicteric, EOMI  Nose: Mucous membranes moist, not inflammed, nonerythematous.  Throat: Oropharynx nonerythematous, no exudate appreciated.   Neck:  Supple, trachea midline.  Lungs:  Normal respiratory effort. Clear to auscultation BL without crackles or wheezes.  Heart: RRR. S1 and S2 normal without gallop, murmur, or rubs.  Abdomen:  BS normoactive. Soft, Nondistended, non-tender.  No masses or organomegaly.  Extremities: trace pretibial edema.  Neurologic: A&O X3, Motor strength is 5/5 in the all 4 extremities  Skin: No visible rashes, scars.    Lab results: Basic Metabolic Panel:  Recent Labs Lab 02/01/15 1709 02/02/15 0211  NA 134* 138  K 4.6 3.9  CL 97* 102  CO2 22 24  GLUCOSE 251* 86  BUN 35* 38*  CREATININE 3.64* 3.84*  CALCIUM 8.8* 8.1*    Liver Function Tests: No results for input(s): AST, ALT, ALKPHOS, BILITOT, PROT, ALBUMIN in the last 168 hours. No results for input(s): LIPASE, AMYLASE in the last 168 hours. No results for input(s): AMMONIA in the last 168 hours.  CBC:  Recent Labs Lab 02/01/15 1709 02/02/15 0211  WBC 26.8* 19.5*  NEUTROABS 25.0*  --   HGB 11.5* 11.1*  HCT 35.6* 34.1*  MCV 89.0 90.1  PLT 148* 128*    Cardiac Enzymes:  Recent Labs Lab 02/01/15 1709  TROPONINI 0.42*    BNP: Invalid input(s): POCBNP  CBG:  Recent Labs Lab 02/01/15 2138 02/02/15 0728 02/02/15 1118  GLUCAP 150* 130* 169*    Microbiology: Results for orders placed or performed during the hospital encounter of 02/01/15  Blood Culture (routine x 2)     Status: None (Preliminary result)   Collection Time: 02/01/15  6:35 PM  Result Value Ref Range Status   Specimen Description BLOOD LEFT ARM  Final   Special Requests BOTTLES DRAWN AEROBIC AND ANAEROBIC 5ML  Final   Culture NO GROWTH < 24 HOURS  Final   Report Status PENDING  Incomplete  Blood Culture (routine x 2)     Status: None (Preliminary result)   Collection Time: 02/01/15  6:40 PM  Result Value Ref Range Status   Specimen Description BLOOD RIGHT HAND  Final   Special Requests BOTTLES DRAWN AEROBIC AND ANAEROBIC 5ML  Final   Culture NO  GROWTH < 24 HOURS  Final   Report Status PENDING  Incomplete  MRSA PCR Screening     Status: None   Collection Time: 02/01/15  9:37 PM  Result Value Ref Range Status   MRSA by PCR NEGATIVE NEGATIVE Final    Comment:        The GeneXpert MRSA Assay (FDA approved for NASAL specimens only), is one component of a comprehensive MRSA colonization surveillance program. It is not intended to diagnose MRSA infection nor to guide or monitor treatment for MRSA infections.   Urine culture     Status: None (Preliminary result)   Collection Time: 02/02/15 12:11 AM  Result Value Ref Range Status   Specimen Description URINE, RANDOM  Final   Special Requests Normal  Final   Culture NO GROWTH < 12 HOURS  Final   Report Status PENDING  Incomplete    Coagulation Studies: No results for input(s): LABPROT, INR in the last 72 hours.  Urinalysis:  Recent Labs  02/02/15 0011  COLORURINE AMBER*  LABSPEC 1.018  PHURINE 5.0  GLUCOSEU 50*  HGBUR 3+*  BILIRUBINUR 1+*  KETONESUR NEGATIVE  PROTEINUR 100*  NITRITE NEGATIVE  LEUKOCYTESUR 1+*      Imaging: Dg Chest 2 View  02/01/2015  CLINICAL DATA:  Worsening shortness of breath and abdominal pain since 01/29/2015. Initial encounter. EXAM: CHEST  2 VIEW COMPARISON:  CT chest 10/08/2010. FINDINGS: Studies limb about the patient's size. The lungs appear clear. Heart size is normal. No pneumothorax or pleural effusion. IMPRESSION: No acute disease. Electronically Signed   By: Inge Rise M.D.   On: 02/01/2015 15:14   US Renal  02/02/2015  CLINICAL DATA:  Acute kidney injury. 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 EXAM: RENAL / URINARY TRACT ULTRASOUND COMPLETE COMPARISON:  03/18/2011 CT FINDINGS: Right Kidney: Length: Not visualized. Diminutive right kidney identified by previous CT is not visualized today.  Left Kidney: Length: 16.8 cm. Echogenicity is normal. No hydronephrosis identified. Upper pole hypoechoic mass is 5.0 x 6.2 x  4.7 cm. Findings are consistent with benign, stable cyst compared with previous CT exam. Bladder: Bladder is decompressed by a Foley catheter, limiting evaluation of the bladder. Additional: Study quality is degraded by patient body habitus. IMPRESSION: 1. Nonvisualized right kidney, known to be diminutive based on prior CT exam. 2. No hydronephrosis on the left. 3. Upper pole left renal cyst. Electronically Signed   By: Nolon Nations M.D.   On: 02/02/2015 09:30      Assessment & Plan: Pt is a 75 y.o. male with a PMHx of diabetes mellitus, hypertension, rheumatoid arthritis, congenitally atrophic right kidney who was admitted to [facility] on 02/01/2015 for evaluation of shortness of breath and lower abdominal pain.   1. Acute renal failure/proteinuria. Patient presented with poor by mouth intake and severe urinary tract infection. Ultrasound was negative for hydronephrosis. He is known to have a congenitally absent/very atrophic right kidney. Baseline creatinine 1.1. Creatinine currently 3.84 and has the potential to worsen over the course of the hospitalization. No urgent indication for dialysis at the moment. Continue IV fluid hydration as you're doing. Check SPEP, UPEP, ANA, ANCA antibodies, GBM antibodies, C3, C4 for additional workup.  2. Urinary tract infection. Pyuria and hematuria noted. Agree with broad-spectrum metabolic including Zosyn.  3. Anemia unspecified. Hemoglobin currently 11.1. We will check SPEP and UPEP as above.  4. Thank you for consultation. Further plan as patient progresses.

## 2015-02-03 LAB — C3 COMPLEMENT: C3 COMPLEMENT: 134 mg/dL (ref 82–167)

## 2015-02-03 LAB — PROTEIN ELECTROPHORESIS, SERUM
A/G Ratio: 0.8 (ref 0.7–1.7)
ALPHA-1-GLOBULIN: 0.4 g/dL (ref 0.0–0.4)
Albumin ELP: 2.5 g/dL — ABNORMAL LOW (ref 2.9–4.4)
Alpha-2-Globulin: 0.9 g/dL (ref 0.4–1.0)
Beta Globulin: 1 g/dL (ref 0.7–1.3)
GAMMA GLOBULIN: 0.9 g/dL (ref 0.4–1.8)
GLOBULIN, TOTAL: 3.2 g/dL (ref 2.2–3.9)
TOTAL PROTEIN ELP: 5.7 g/dL — AB (ref 6.0–8.5)

## 2015-02-03 LAB — CBC
HCT: 33.3 % — ABNORMAL LOW (ref 40.0–52.0)
Hemoglobin: 11 g/dL — ABNORMAL LOW (ref 13.0–18.0)
MCH: 28.9 pg (ref 26.0–34.0)
MCHC: 33.1 g/dL (ref 32.0–36.0)
MCV: 87.5 fL (ref 80.0–100.0)
Platelets: 125 10*3/uL — ABNORMAL LOW (ref 150–440)
RBC: 3.81 MIL/uL — AB (ref 4.40–5.90)
RDW: 13.6 % (ref 11.5–14.5)
WBC: 15.9 10*3/uL — ABNORMAL HIGH (ref 3.8–10.6)

## 2015-02-03 LAB — LIPID PANEL
Cholesterol: 147 mg/dL (ref 0–200)
Triglycerides: 224 mg/dL — ABNORMAL HIGH (ref <150)
VLDL: 45 mg/dL — ABNORMAL HIGH (ref 0–40)

## 2015-02-03 LAB — GLUCOSE, CAPILLARY
GLUCOSE-CAPILLARY: 153 mg/dL — AB (ref 65–99)
GLUCOSE-CAPILLARY: 152 mg/dL — AB (ref 65–99)
Glucose-Capillary: 153 mg/dL — ABNORMAL HIGH (ref 65–99)
Glucose-Capillary: 134 mg/dL — ABNORMAL HIGH (ref 65–99)

## 2015-02-03 LAB — BASIC METABOLIC PANEL
Anion gap: 11 (ref 5–15)
BUN: 57 mg/dL — AB (ref 6–20)
CALCIUM: 7.6 mg/dL — AB (ref 8.9–10.3)
CHLORIDE: 98 mmol/L — AB (ref 101–111)
CO2: 19 mmol/L — AB (ref 22–32)
CREATININE: 6.48 mg/dL — AB (ref 0.61–1.24)
GFR calc Af Amer: 9 mL/min — ABNORMAL LOW (ref >60)
GFR calc non Af Amer: 7 mL/min — ABNORMAL LOW (ref >60)
GLUCOSE: 169 mg/dL — AB (ref 65–99)
Potassium: 3.9 mmol/L (ref 3.5–5.1)
Sodium: 128 mmol/L — ABNORMAL LOW (ref 135–145)

## 2015-02-03 LAB — GLOMERULAR BASEMENT MEMBRANE ANTIBODIES: GBM AB: 4 U (ref 0–20)

## 2015-02-03 LAB — PROTEIN / CREATININE RATIO, URINE
CREATININE, URINE: 174 mg/dL
Protein Creatinine Ratio: 1.98 mg/mg{Cre} — ABNORMAL HIGH (ref 0.00–0.15)
TOTAL PROTEIN, URINE: 344 mg/dL

## 2015-02-03 LAB — C4 COMPLEMENT: Complement C4, Body Fluid: 38 mg/dL (ref 14–44)

## 2015-02-03 LAB — MPO/PR-3 (ANCA) ANTIBODIES: Myeloperoxidase Abs: <9 U/mL (ref 0.0–9.0)

## 2015-02-03 MED ORDER — PIPERACILLIN-TAZOBACTAM 3.375 G IVPB
3.3750 g | Freq: Two times a day (BID) | INTRAVENOUS | Status: DC
Start: 2015-02-03 — End: 2015-02-04
  Administered 2015-02-03 – 2015-02-04 (×2): 3.375 g via INTRAVENOUS
  Filled 2015-02-03 (×4): qty 50

## 2015-02-03 NOTE — Clinical Documentation Improvement (Signed)
Internal Medicine  Can the diagnosis of anemia be further specified? Please document findings in next progress note; NOT in BPA drop down box. Thanks!   Iron deficiency Anemia  Nutritional anemia, including the nutrition or mineral deficits  Chronic Blood Loss Anemia, including the suspected or known cause  Anemia of chronic disease, including the associated chronic disease state  Other  Clinically Undetermined  Document any associated diagnoses/conditions.  Supporting Information:  H&H's running from 9.3/27.6   To    9.1/27.6  Please exercise your independent, professional judgment when responding. A specific answer is not anticipated or expected.  Thank You,  Zoila Shutter RN, BSN, [place] [phone]

## 2015-02-03 NOTE — Progress Notes (Signed)
[facility]  ROUNDING NOTE   Subjective:  Patient will go over to floor care. Renal function significantly worse. BUN up to 57 and creatinine 6.48.    Objective:  Vital signs in last 24 hours:  Temp:  [97.9 F (36.6 C)-98.2 F (36.8 C)] 98.2 F (36.8 C) (01/20 0531) Pulse Rate:  [93-98] 98 (01/20 0531) Resp:  [20] 20 (01/19 2135) BP: (81-151)/(47-88) 151/88 mmHg (01/20 0531) SpO2:  [97 %-100 %] 100 % (01/20 0531)  Weight change:  Filed Weights   02/01/15 1419 02/01/15 2132  Weight: 160.12 kg (353 lb) 195.9 kg (431 lb 14.1 oz)    Intake/Output: I/O last 3 completed shifts: In: 2772.9 [P.O.:1200; I.V.:1472.9; IV Piggyback:100] Out: 1075 [Urine:1075]   Intake/Output this shift:  Total I/O In: 1540 [P.O.:120; I.V.:1320; IV Piggyback:100] Out: 75 [Urine:75]  Physical Exam: General: Obese, NAD  Head: Normocephalic, atraumatic. Moist oral mucosal membranes  Eyes: Anicteric  Neck: Supple, trachea midline  Lungs:  Clear to auscultation, normal effort  Heart: Regular rate and rhythm  Abdomen:  Soft, mild lower abdominal tenderness, BS present  Extremities:  1+ peripheral edema.  Neurologic: Nonfocal, moving all four extremities  Skin: No lesions       Basic Metabolic Panel:  Recent Labs Lab 02/01/15 1709 02/02/15 0211 02/03/15 0651  NA 134* 138 128*  K 4.6 3.9 3.9  CL 97* 102 98*  CO2 22 24 19*  GLUCOSE 251* 86 169*  BUN 35* 38* 57*  CREATININE 3.64* 3.84* 6.48*  CALCIUM 8.8* 8.1* 7.6*    Liver Function Tests: No results for input(s): AST, ALT, ALKPHOS, BILITOT, PROT, ALBUMIN in the last 168 hours. No results for input(s): LIPASE, AMYLASE in the last 168 hours. No results for input(s): AMMONIA in the last 168 hours.  CBC:  Recent Labs Lab 02/01/15 1709 02/02/15 0211 02/03/15 0651  WBC 26.8* 19.5* 15.9*  NEUTROABS 25.0*  --   --   HGB 11.5* 11.1* 11.0*  HCT 35.6* 34.1* 33.3*  MCV 89.0 90.1 87.5  PLT 148* 128* 125*    Cardiac  Enzymes:  Recent Labs Lab 02/01/15 1709 02/02/15 1537  TROPONINI 0.42* 0.52*    BNP: Invalid input(s): POCBNP  CBG:  Recent Labs Lab 02/02/15 1118 02/02/15 1622 02/02/15 2137 02/03/15 0716 02/03/15 1121  GLUCAP 169* 161* 153* 153* 153*    Microbiology: Results for orders placed or performed during the hospital encounter of 02/01/15  Blood Culture (routine x 2)     Status: None (Preliminary result)   Collection Time: 02/01/15  6:35 PM  Result Value Ref Range Status   Specimen Description BLOOD LEFT ARM  Final   Special Requests BOTTLES DRAWN AEROBIC AND ANAEROBIC 5ML  Final   Culture NO GROWTH 2 DAYS  Final   Report Status PENDING  Incomplete  Blood Culture (routine x 2)     Status: None (Preliminary result)   Collection Time: 02/01/15  6:40 PM  Result Value Ref Range Status   Specimen Description BLOOD RIGHT HAND  Final   Special Requests BOTTLES DRAWN AEROBIC AND ANAEROBIC 5ML  Final   Culture NO GROWTH 2 DAYS  Final   Report Status PENDING  Incomplete  MRSA PCR Screening     Status: None   Collection Time: 02/01/15  9:37 PM  Result Value Ref Range Status   MRSA by PCR NEGATIVE NEGATIVE Final    Comment:        The GeneXpert MRSA Assay (FDA approved for NASAL specimens only), is  one component of a comprehensive MRSA colonization surveillance program. It is not intended to diagnose MRSA infection nor to guide or monitor treatment for MRSA infections.   Urine culture     Status: None (Preliminary result)   Collection Time: 02/02/15 12:11 AM  Result Value Ref Range Status   Specimen Description URINE, RANDOM  Final   Special Requests Normal  Final   Culture NO GROWTH 1 DAY  Final   Report Status PENDING  Incomplete    Coagulation Studies: No results for input(s): LABPROT, INR in the last 72 hours.  Urinalysis:  Recent Labs  02/02/15 0011  COLORURINE AMBER*  LABSPEC 1.018  PHURINE 5.0  GLUCOSEU 50*  HGBUR 3+*  BILIRUBINUR 1+*  KETONESUR  NEGATIVE  PROTEINUR 100*  NITRITE NEGATIVE  LEUKOCYTESUR 1+*      Imaging: Dg Chest 2 View  02/01/2015  CLINICAL DATA:  Worsening shortness of breath and abdominal pain since 01/29/2015. Initial encounter. EXAM: CHEST  2 VIEW COMPARISON:  CT chest 10/08/2010. FINDINGS: Studies limb about the patient's size. The lungs appear clear. Heart size is normal. No pneumothorax or pleural effusion. IMPRESSION: No acute disease. Electronically Signed   By: Inge Rise M.D.   On: 02/01/2015 15:14   US Renal  02/02/2015  CLINICAL DATA:  Acute kidney injury. 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 EXAM: RENAL / URINARY TRACT ULTRASOUND COMPLETE COMPARISON:  03/18/2011 CT FINDINGS: Right Kidney: Length: Not visualized. Diminutive right kidney identified by previous CT is not visualized today. Left Kidney: Length: 16.8 cm. Echogenicity is normal. No hydronephrosis identified. Upper pole hypoechoic mass is 5.0 x 6.2 x 4.7 cm. Findings are consistent with benign, stable cyst compared with previous CT exam. Bladder: Bladder is decompressed by a Foley catheter, limiting evaluation of the bladder. Additional: Study quality is degraded by patient body habitus. IMPRESSION: 1. Nonvisualized right kidney, known to be diminutive based on prior CT exam. 2. No hydronephrosis on the left. 3. Upper pole left renal cyst. Electronically Signed   By: Nolon Nations M.D.   On: 02/02/2015 09:30     Medications:   . sodium chloride 75 mL/hr at 02/03/15 1050   . aspirin  325 mg Oral Daily  . heparin  5,000 Units Subcutaneous 3 times per day  . insulin aspart  0-5 Units Subcutaneous QHS  . insulin aspart  0-9 Units Subcutaneous TID WC  . insulin glargine  8 Units Subcutaneous QHS  . piperacillin-tazobactam (ZOSYN)  IV  3.375 g Intravenous Q12H  . sodium chloride  3 mL Intravenous Q12H   acetaminophen **OR** acetaminophen  Assessment/ Plan:  75 y.o. male  with a PMHx of diabetes mellitus, hypertension,  rheumatoid arthritis, congenitally atrophic right kidney who was admitted to [facility] on 02/01/2015 for evaluation of shortness of breath and lower abdominal pain.   1. Acute renal failure/proteinuria. Patient presented with poor by mouth intake and severe urinary tract infection.Ultrasound was negative for hydronephrosis. He is known to have a congenitally absent/very atrophic right kidney. Baseline creatinine 1.1.  -  Renal function significantly worse today.  ATN often presents in this manner however.  Urine output 875 cc over the past week for hours.  Therefore no urgent indication for dialysis however if renal function continues to worsen we may need to consider renal replacement therapy and this was discussed with the patient.  Continue to monitor renal parameters closely.  Awaiting further serologic workup.  2. Urinary tract infection. Pyuria and hematuria noted.  Urine  culture remains negative thus far.  Continue Zosyn for now.  3. Anemia unspecified.  Hemoglobin stable at 11.1.  Awaiting further serologic work up.   LOS: 2 Dakota Estes 1/20/201711:44 AM

## 2015-02-03 NOTE — Progress Notes (Addendum)
[place] [facility] [place] NAME: Jacques Popke    MR#:  [id number]  DATE OF BIRTH:  July 13, 1940  SUBJECTIVE:  CHIEF COMPLAINT:   Chief Complaint  Patient presents with  . Shortness of Breath   no shortness of breath, nausea or vomiting, but feels weak and sleepy.  REVIEW OF SYSTEMS:  CONSTITUTIONAL: No fever, but has weakness.  EYES: No blurred or double vision.  EARS, NOSE, AND THROAT: No tinnitus or ear pain.  RESPIRATORY: No cough, shortness of breath, wheezing or hemoptysis.  CARDIOVASCULAR: No chest pain, orthopnea, edema.  GASTROINTESTINAL: No nausea, vomiting, diarrhea or abdominal pain.  GENITOURINARY: No dysuria, hematuria.  ENDOCRINE: No polyuria, nocturia,  HEMATOLOGY: No anemia, easy bruising or bleeding SKIN: No rash or lesion. MUSCULOSKELETAL: No joint pain or arthritis.   NEUROLOGIC: No tingling, numbness, weakness.  PSYCHIATRY: No anxiety or depression.   DRUG ALLERGIES:  No Known Allergies  VITALS:  Blood pressure 109/54, pulse 79, temperature 97.5 F (36.4 C), temperature source Oral, resp. rate 20, height 6\' 5"  (1.956 m), weight 195.9 kg (431 lb 14.1 oz), SpO2 100 %.  PHYSICAL EXAMINATION:  GENERAL:  75 y.o.-year-old patient lying in the bed with no acute distress. Morbidly obese. EYES: Pupils equal, round, reactive to light and accommodation. No scleral icterus. Extraocular muscles intact.  HEENT: Head atraumatic, normocephalic. Oropharynx and nasopharynx clear.  NECK:  Supple, no jugular venous distention. No thyroid enlargement, no tenderness.  LUNGS: Normal breath sounds bilaterally, no wheezing, rales,rhonchi or crepitation. No use of accessory muscles of respiration.  CARDIOVASCULAR: S1, S2 normal. No murmurs, rubs, or gallops.  ABDOMEN: Soft, nontender, nondistended. Bowel sounds present. No organomegaly or mass.  EXTREMITIES: trace leg edema, no cyanosis, or clubbing.  NEUROLOGIC: Cranial nerves II  through XII are intact. Muscle strength 5/5 in all extremities. Sensation intact. Gait not checked.  PSYCHIATRIC: The patient is alert and oriented x 3.  SKIN: No obvious rash, lesion, or ulcer.    LABORATORY PANEL:   CBC  Recent Labs Lab 02/03/15 0651  WBC 15.9*  HGB 11.0*  HCT 33.3*  PLT 125*   ------------------------------------------------------------------------------------------------------------------  Chemistries   Recent Labs Lab 02/03/15 0651  NA 128*  K 3.9  CL 98*  CO2 19*  GLUCOSE 169*  BUN 57*  CREATININE 6.48*  CALCIUM 7.6*   ------------------------------------------------------------------------------------------------------------------  Cardiac Enzymes  Recent Labs Lab 02/02/15 1537  TROPONINI 0.52*   ------------------------------------------------------------------------------------------------------------------  RADIOLOGY:  Dg Chest 2 View  02/01/2015  CLINICAL DATA:  Worsening shortness of breath and abdominal pain since 01/29/2015. Initial encounter. EXAM: CHEST  2 VIEW COMPARISON:  CT chest 10/08/2010. FINDINGS: Studies limb about the patient's size. The lungs appear clear. Heart size is normal. No pneumothorax or pleural effusion. IMPRESSION: No acute disease. Electronically Signed   By: Inge Rise M.D.   On: 02/01/2015 15:14   US Renal  02/02/2015  CLINICAL DATA:  Acute kidney injury. 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 EXAM: RENAL / URINARY TRACT ULTRASOUND COMPLETE COMPARISON:  03/18/2011 CT FINDINGS: Right Kidney: Length: Not visualized. Diminutive right kidney identified by previous CT is not visualized today. Left Kidney: Length: 16.8 cm. Echogenicity is normal. No hydronephrosis identified. Upper pole hypoechoic mass is 5.0 x 6.2 x 4.7 cm. Findings are consistent with benign, stable cyst compared with previous CT exam. Bladder: Bladder is decompressed by a Foley catheter, limiting evaluation of the bladder.  Additional: Study quality is degraded by patient body habitus.  IMPRESSION: 1. Nonvisualized right kidney, known to be diminutive based on prior CT exam. 2. No hydronephrosis on the left. 3. Upper pole left renal cyst. Electronically Signed   By: Nolon Nations M.D.   On: 02/02/2015 09:30    EKG:   Orders placed or performed during the hospital encounter of 02/01/15  . ED EKG  . ED EKG  . EKG 12-Lead  . EKG 12-Lead    ASSESSMENT AND PLAN:   1. Septic shock. Improved with normal saline bolus 4 L. hold Altace, continue NS IV.   2. Severe sepsis with UTI. Leukocytosis is improving.  continue Zosyn, follow-up CBC, urine culture and blood culture, so far negative.   3.  lactic acidosis, improved with IV fluid support and antibiotics.  4. Acute renal failure with one kidney. Ultrasound was negative for hydronephrosis. continue normal saline iv hydration. follow-up BMP. No dialysis indication at this time, but if renal function continues to worsen we may need to consider renal replacement therapy per Dr. Holley Raring.  hold Altace.  * Hyponatremia. Continue normal saline IV and follow-up BMP.  5. Elevated troponin, possible due to septic shock and acute renal failure. Follow-up troponin. Continue aspirin and a follow-up lipid panel.  6. Type 2 diabetes mellitus.  Controlled, continue low-dose Lantus and sliding scale  7. Morbid obesity.   Discussed with Dr. Holley Raring. All the records are reviewed and case discussed with Care Management/Social Workerr. Management plans discussed with the patient, family and they are in agreement.  CODE STATUS: Full code  TOTAL TIME TAKING CARE OF THIS PATIENT: 39 minutes.  Greater than 50% time was spent on coordination of care and face-to-face counseling.  POSSIBLE D/C IN 3 DAYS, DEPENDING ON CLINICAL CONDITION.   Demetrios Loll M.D on 02/03/2015 at 2:05 PM  Between 7am to 6pm - Pager - [phone]  After 6pm go to [email or web address] - password [email or web address]  [facility]  [facility]  Office  [phone]  CC: Primary care physician; No primary care provider on file.

## 2015-02-03 NOTE — Care Management Important Message (Signed)
Important Message  Patient Details  Name: Dakota Estes MRN: [id number] Date of Birth: 17-Nov-1940   Medicare Important Message Given:  Yes    [name] [name] 02/03/2015, 10:07 AM

## 2015-02-03 NOTE — Progress Notes (Signed)
Pharmacy Antibiotic Follow-up Note  Dakota Estes is a 75 y.o. year-old male admitted on 02/01/2015.  The patient is currently on day 2  of Zosyn therapy  for sepsis .  Assessment/Plan: Pharmacy consulted to dose and monitor Zosyn therapy in this morbidly obese male being treated for sepsis. Patient's serum creatinine increased significantly overnight from 3.84 to 6.48. Will renally adjust dose due to AKI to Zosyn 3.375 g IV q12 hours.   Temp (24hrs), Avg:98.1 F (36.7 C), Min:97.9 F (36.6 C), Max:98.2 F (36.8 C)   Recent Labs Lab 02/01/15 1709 02/02/15 0211 02/03/15 0651  WBC 26.8* 19.5* 15.9*    Recent Labs Lab 02/01/15 1709 02/02/15 0211 02/03/15 0651  CREATININE 3.64* 3.84* 6.48*   Estimated Creatinine Clearance: 18.4 mL/min (by C-G formula based on Cr of 6.48).    No Known Allergies  Antimicrobials this admission: Zosyn 4.5 g changed to Zosyn 3.375 g  Started on 1/19 >>    >>   Levels/dose changes this admission:   Microbiology results:  BCx: pending   UCx: pending     Sputum:    MRSA PCR: Negative  Thank you for allowing pharmacy to be a part of this patient's care.  Larene Beach PharmD 02/03/2015 10:26 AM

## 2015-02-03 NOTE — Plan of Care (Signed)
Problem: Fluid Volume: Goal: Hemodynamic stability will improve Outcome: Progressing Patient stable throughout shift.  IVF .9 infusing at 125 mL/hr.  Zosyn given per eMAR.  Patient's foley was removed at 10:00 am on 1/19 and he voided for the first time at 19:15.

## 2015-02-04 ENCOUNTER — Inpatient Hospital Stay: Payer: Commercial Managed Care - HMO

## 2015-02-04 LAB — CBC
HEMATOCRIT: 31.2 % — AB (ref 40.0–52.0)
Hemoglobin: 10.4 g/dL — ABNORMAL LOW (ref 13.0–18.0)
MCH: 29 pg (ref 26.0–34.0)
MCHC: 33.2 g/dL (ref 32.0–36.0)
MCV: 87.2 fL (ref 80.0–100.0)
PLATELETS: 129 10*3/uL — AB (ref 150–440)
RBC: 3.58 MIL/uL — ABNORMAL LOW (ref 4.40–5.90)
RDW: 13.7 % (ref 11.5–14.5)
WBC: 14.4 10*3/uL — ABNORMAL HIGH (ref 3.8–10.6)

## 2015-02-04 LAB — BASIC METABOLIC PANEL
Anion gap: 11 (ref 5–15)
BUN: 70 mg/dL — AB (ref 6–20)
CHLORIDE: 103 mmol/L (ref 101–111)
CO2: 20 mmol/L — AB (ref 22–32)
CREATININE: 7.65 mg/dL — AB (ref 0.61–1.24)
Calcium: 8.1 mg/dL — ABNORMAL LOW (ref 8.9–10.3)
GFR calc Af Amer: 7 mL/min — ABNORMAL LOW (ref >60)
GFR calc non Af Amer: 6 mL/min — ABNORMAL LOW (ref >60)
Glucose, Bld: 142 mg/dL — ABNORMAL HIGH (ref 65–99)
POTASSIUM: 4.2 mmol/L (ref 3.5–5.1)
Sodium: 134 mmol/L — ABNORMAL LOW (ref 135–145)

## 2015-02-04 LAB — GLUCOSE, CAPILLARY
GLUCOSE-CAPILLARY: 149 mg/dL — AB (ref 65–99)
Glucose-Capillary: 152 mg/dL — ABNORMAL HIGH (ref 65–99)
Glucose-Capillary: 137 mg/dL — ABNORMAL HIGH (ref 65–99)
Glucose-Capillary: 138 mg/dL — ABNORMAL HIGH (ref 65–99)

## 2015-02-04 LAB — URINE CULTURE
CULTURE: NO GROWTH
Special Requests: NORMAL

## 2015-02-04 MED ORDER — CEFTRIAXONE SODIUM 1 G IJ SOLR
1.0000 g | INTRAMUSCULAR | Status: DC
  Administered 2015-02-04 – 2015-02-06 (×3): 1 g via INTRAVENOUS
  Filled 2015-02-04 (×5): qty 10

## 2015-02-04 MED ORDER — ZOLPIDEM TARTRATE 5 MG PO TABS
5.0000 mg | ORAL_TABLET | Freq: Every evening | ORAL | Status: DC | PRN
  Administered 2015-02-05 – 2015-02-10 (×4): 5 mg via ORAL
  Filled 2015-02-04 (×6): qty 1

## 2015-02-04 MED ORDER — SODIUM CHLORIDE 0.9 % IV SOLN: 100.0000 mL | INTRAVENOUS | Status: DC | PRN

## 2015-02-04 MED ORDER — PENTAFLUOROPROP-TETRAFLUOROETH EX AERO
1.0000 "application " | INHALATION_SPRAY | CUTANEOUS | Status: DC | PRN
  Filled 2015-02-04: qty 30

## 2015-02-04 MED ORDER — ALTEPLASE 2 MG IJ SOLR
2.0000 mg | Freq: Once | INTRAMUSCULAR | Status: DC | PRN
  Filled 2015-02-04: qty 2

## 2015-02-04 NOTE — Plan of Care (Signed)
Problem: Respiratory: Goal: Ability to maintain adequate ventilation will improve Outcome: Progressing Pt is alert and oriented, no c/o pain. Pt is fatigued, awakens easily to voice and touch, poor appetite continues. Repositioned in bed at needed, significant other at bedside, supportive. NS at 75 ml/hr continued.

## 2015-02-04 NOTE — Progress Notes (Signed)
[facility]  ROUNDING NOTE   Subjective:  Renal function worse at present. BUN up to 17 creatinine 7.65. We discussed proceeding with hemodialysis with the patient today and he is agreeable.    Objective:  Vital signs in last 24 hours:  Temp:  [97.5 F (36.4 C)-98 F (36.7 C)] 97.8 F (36.6 C) (01/21 1245) Pulse Rate:  [72-83] 72 (01/21 1245) Resp:  [18-20] 20 (01/21 1245) BP: (109-134)/(54-70) 134/59 mmHg (01/21 1245) SpO2:  [93 %-100 %] 96 % (01/21 1245)  Weight change:  Filed Weights   02/01/15 1419 02/01/15 2132  Weight: 160.12 kg (353 lb) 195.9 kg (431 lb 14.1 oz)    Intake/Output: I/O last 3 completed shifts: In: [id number] [P.O.:360; I.V.:3016; IV Piggyback:185] Out: 850 [Urine:850]   Intake/Output this shift:  Total I/O In: 240 [P.O.:240] Out: 600 [Urine:600]  Physical Exam: General: Obese, NAD  Head: Normocephalic, atraumatic. Moist oral mucosal membranes  Eyes: Anicteric  Neck: Supple, trachea midline  Lungs:  Clear to auscultation, normal effort  Heart: Regular rate and rhythm  Abdomen:  Soft, mild lower abdominal tenderness, BS present  Extremities:  1+ peripheral edema.  Neurologic: Nonfocal, moving all four extremities  Skin: No lesions       Basic Metabolic Panel:  Recent Labs Lab 02/01/15 1709 02/02/15 0211 02/03/15 0651 02/04/15 0445  NA 134* 138 128* 134*  K 4.6 3.9 3.9 4.2  CL 97* 102 98* 103  CO2 22 24 19* 20*  GLUCOSE 251* 86 169* 142*  BUN 35* 38* 57* 70*  CREATININE 3.64* 3.84* 6.48* 7.65*  CALCIUM 8.8* 8.1* 7.6* 8.1*    Liver Function Tests: No results for input(s): AST, ALT, ALKPHOS, BILITOT, PROT, ALBUMIN in the last 168 hours. No results for input(s): LIPASE, AMYLASE in the last 168 hours. No results for input(s): AMMONIA in the last 168 hours.  CBC:  Recent Labs Lab 02/01/15 1709 02/02/15 0211 02/03/15 0651 02/04/15 0445  WBC 26.8* 19.5* 15.9* 14.4*  NEUTROABS 25.0*  --   --   --   HGB 11.5* 11.1*  11.0* 10.4*  HCT 35.6* 34.1* 33.3* 31.2*  MCV 89.0 90.1 87.5 87.2  PLT 148* 128* 125* 129*    Cardiac Enzymes:  Recent Labs Lab 02/01/15 1709 02/02/15 1537  TROPONINI 0.42* 0.52*    BNP: Invalid input(s): POCBNP  CBG:  Recent Labs Lab 02/03/15 1121 02/03/15 1600 02/03/15 2209 02/04/15 0726 02/04/15 1125  GLUCAP 153* 152* 134* 152* 137*    Microbiology: Results for orders placed or performed during the hospital encounter of 02/01/15  Blood Culture (routine x 2)     Status: None (Preliminary result)   Collection Time: 02/01/15  6:35 PM  Result Value Ref Range Status   Specimen Description BLOOD LEFT ARM  Final   Special Requests BOTTLES DRAWN AEROBIC AND ANAEROBIC 5ML  Final   Culture NO GROWTH 3 DAYS  Final   Report Status PENDING  Incomplete  Blood Culture (routine x 2)     Status: None (Preliminary result)   Collection Time: 02/01/15  6:40 PM  Result Value Ref Range Status   Specimen Description BLOOD RIGHT HAND  Final   Special Requests BOTTLES DRAWN AEROBIC AND ANAEROBIC 5ML  Final   Culture NO GROWTH 3 DAYS  Final   Report Status PENDING  Incomplete  MRSA PCR Screening     Status: None   Collection Time: 02/01/15  9:37 PM  Result Value Ref Range Status   MRSA by PCR NEGATIVE NEGATIVE  Final    Comment:        The GeneXpert MRSA Assay (FDA approved for NASAL specimens only), is one component of a comprehensive MRSA colonization surveillance program. It is not intended to diagnose MRSA infection nor to guide or monitor treatment for MRSA infections.   Urine culture     Status: None   Collection Time: 02/02/15 12:11 AM  Result Value Ref Range Status   Specimen Description URINE, RANDOM  Final   Special Requests Normal  Final   Culture NO GROWTH 2 DAYS  Final   Report Status 02/04/2015 FINAL  Final    Coagulation Studies: No results for input(s): LABPROT, INR in the last 72 hours.  Urinalysis:  Recent Labs  02/02/15 0011  COLORURINE AMBER*   LABSPEC 1.018  PHURINE 5.0  GLUCOSEU 50*  HGBUR 3+*  BILIRUBINUR 1+*  KETONESUR NEGATIVE  PROTEINUR 100*  NITRITE NEGATIVE  LEUKOCYTESUR 1+*      Imaging: No results found.   Medications:   . sodium chloride 75 mL/hr at 02/04/15 0544   . aspirin  325 mg Oral Daily  . cefTRIAXone (ROCEPHIN)  IV  1 g Intravenous Q24H  . heparin  5,000 Units Subcutaneous 3 times per day  . insulin aspart  0-5 Units Subcutaneous QHS  . insulin aspart  0-9 Units Subcutaneous TID WC  . insulin glargine  8 Units Subcutaneous QHS  . sodium chloride  3 mL Intravenous Q12H   acetaminophen **OR** acetaminophen  Assessment/ Plan:  75 y.o. male  with a PMHx of diabetes mellitus, hypertension, rheumatoid arthritis, congenitally atrophic right kidney who was admitted to [facility] on 02/01/2015 for evaluation of shortness of breath and lower abdominal pain.   1. Acute renal failure/proteinuria. Patient presented with poor by mouth intake and severe urinary tract infection.Ultrasound was negative for hydronephrosis. He is known to have a congenitally absent/very atrophic right kidney. Baseline creatinine 1.1. Negative SPEP/UPEP/ANCA antibodies/GBM antibodies -  Renal function continues to worsen. At this point in time we will proceed with renal replacement therapy in the form of hemodialysis. We have requested vascular surgery consult for placement of temporary dialysis catheter. He will need dialysis today and tomorrow.  2. Urinary tract infection. Pyuria and hematuria noted.  Urine culture remains negative. Patient transitioned to ceftriaxone.  3. Anemia unspecified.  Hemoglobin down to 10.4. Platelets also low at 129,000. SPEP and UPEP negative.   LOS: 3 Dakota Estes 1/21/20171:04 PM

## 2015-02-04 NOTE — Progress Notes (Signed)
BUN and creatinine increased, IV fluids infusing, on IV antibiotics, up to chair throughout shift, on room air, temporary cath placed, awaiting for hemo dialysis to be performed.

## 2015-02-04 NOTE — Op Note (Signed)
  OPERATIVE NOTE   PROCEDURE: 1. Insertion of temporary dialysis catheter catheter left internal jugular approach.  PRE-OPERATIVE DIAGNOSIS: Acute on chronic renal insufficiency; diabetes mellitus  POST-OPERATIVE DIAGNOSIS: Same  SURGEON: Katha Cabal M.D.  ANESTHESIA: 1% lidocaine local infiltration  ESTIMATED BLOOD LOSS: Minimal cc  INDICATIONS:   Dakota Estes is a 75 y.o. male who presents with increasing uremic symptoms and worsening of his renal function. He will now require initiation of hemodialysis.Marland Kitchen  DESCRIPTION: After obtaining full informed written consent, the patient was positioned supine. The left neck was prepped and draped in a sterile fashion. Ultrasound was placed in a sterile sleeve. Ultrasound was utilized to identify the left internal jugular vein which is noted to be echolucent and compressible indicating patency. Images recorded for the permanent record. Under real-time visualization a Seldinger needle is inserted into the vein and the guidewires advanced without difficulty. Small counterincision was made at the wire insertion site. Dilator is passed over the wire and the temporary dialysis catheter catheter is fed over the wire without difficulty.  All lumens aspirate and flush easily and are packed with heparin saline. Catheter secured to the skin of the left neck with 2-0 silk. A sterile dressing is applied with Biopatch.  COMPLICATIONS: none  CONDITION: Good  Katha Cabal, M.D. [facility] Office:  [phone]

## 2015-02-04 NOTE — Progress Notes (Addendum)
Patient ID: Dakota Estes, male   DOB: Jun 27, 1940, 75 y.o.   MRN: [id number] [facility] PROGRESS NOTE  Dakota Estes [id number] DOB: September 02, 1940 DOA: 02/01/2015 PCP: No primary care provider on file.  HPI/Subjective: Patient feeling better today than he has been. States he is urinating okay. Feeling a little bit stronger.  Objective: Filed Vitals:   02/04/15 0511 02/04/15 1245  BP: 130/70 134/59  Pulse: 74 72  Temp:  97.8 F (36.6 C)  Resp: 18 20    Filed Weights   02/01/15 1419 02/01/15 2132  Weight: 160.12 kg (353 lb) 195.9 kg (431 lb 14.1 oz)    ROS: Review of Systems  Constitutional: Negative for fever and chills.  Eyes: Negative for blurred vision.  Respiratory: Negative for cough and shortness of breath.   Cardiovascular: Negative for chest pain.  Gastrointestinal: Negative for nausea, vomiting, abdominal pain, diarrhea and constipation.  Genitourinary: Negative for dysuria.  Musculoskeletal: Negative for joint pain.  Neurological: Negative for dizziness and headaches.   Exam: Physical Exam  Constitutional: He is oriented to person, place, and time.  HENT:  Nose: No mucosal edema.  Mouth/Throat: No oropharyngeal exudate or posterior oropharyngeal edema.  Eyes: Conjunctivae, EOM and lids are normal. Pupils are equal, round, and reactive to light.  Neck: No JVD present. Carotid bruit is not present. No edema present. No thyroid mass and no thyromegaly present.  Cardiovascular: S1 normal and S2 normal.  Exam reveals no gallop.   No murmur heard. Pulses:      Dorsalis pedis pulses are 2+ on the right side, and 2+ on the left side.  Respiratory: No respiratory distress. He has no wheezes. He has no rhonchi. He has no rales.  GI: Soft. Bowel sounds are normal. There is no tenderness.  Musculoskeletal:       Right ankle: He exhibits swelling.  Lymphadenopathy:    He has no cervical adenopathy.  Neurological: He is alert and oriented to person,  place, and time. No cranial nerve deficit.  Skin: Skin is warm. Nails show no clubbing.  Chronic lower extremity discoloration  Psychiatric: He has a normal mood and affect.    Data Reviewed: Basic Metabolic Panel:  Recent Labs Lab 02/01/15 1709 02/02/15 0211 02/03/15 0651 02/04/15 0445  NA 134* 138 128* 134*  K 4.6 3.9 3.9 4.2  CL 97* 102 98* 103  CO2 22 24 19* 20*  GLUCOSE 251* 86 169* 142*  BUN 35* 38* 57* 70*  CREATININE 3.64* 3.84* 6.48* 7.65*  CALCIUM 8.8* 8.1* 7.6* 8.1*   CBC:  Recent Labs Lab 02/01/15 1709 02/02/15 0211 02/03/15 0651 02/04/15 0445  WBC 26.8* 19.5* 15.9* 14.4*  NEUTROABS 25.0*  --   --   --   HGB 11.5* 11.1* 11.0* 10.4*  HCT 35.6* 34.1* 33.3* 31.2*  MCV 89.0 90.1 87.5 87.2  PLT 148* 128* 125* 129*   Cardiac Enzymes:  Recent Labs Lab 02/01/15 1709 02/02/15 1537  TROPONINI 0.42* 0.52*   BNP (last 3 results)  Recent Labs  02/01/15 1709  BNP 223.0*    CBG:  Recent Labs Lab 02/03/15 1121 02/03/15 1600 02/03/15 2209 02/04/15 0726 02/04/15 1125  GLUCAP 153* 152* 134* 152* 137*    Recent Results (from the past 240 hour(s))  Blood Culture (routine x 2)     Status: None (Preliminary result)   Collection Time: 02/01/15  6:35 PM  Result Value Ref Range Status   Specimen Description BLOOD LEFT ARM  Final  Special Requests BOTTLES DRAWN AEROBIC AND ANAEROBIC 5ML  Final   Culture NO GROWTH 3 DAYS  Final   Report Status PENDING  Incomplete  Blood Culture (routine x 2)     Status: None (Preliminary result)   Collection Time: 02/01/15  6:40 PM  Result Value Ref Range Status   Specimen Description BLOOD RIGHT HAND  Final   Special Requests BOTTLES DRAWN AEROBIC AND ANAEROBIC 5ML  Final   Culture NO GROWTH 3 DAYS  Final   Report Status PENDING  Incomplete  MRSA PCR Screening     Status: None   Collection Time: 02/01/15  9:37 PM  Result Value Ref Range Status   MRSA by PCR NEGATIVE NEGATIVE Final    Comment:        The  GeneXpert MRSA Assay (FDA approved for NASAL specimens only), is one component of a comprehensive MRSA colonization surveillance program. It is not intended to diagnose MRSA infection nor to guide or monitor treatment for MRSA infections.   Urine culture     Status: None   Collection Time: 02/02/15 12:11 AM  Result Value Ref Range Status   Specimen Description URINE, RANDOM  Final   Special Requests Normal  Final   Culture NO GROWTH 2 DAYS  Final   Report Status 02/04/2015 FINAL  Final     Scheduled Meds: . aspirin  325 mg Oral Daily  . cefTRIAXone (ROCEPHIN)  IV  1 g Intravenous Q24H  . heparin  5,000 Units Subcutaneous 3 times per day  . insulin aspart  0-5 Units Subcutaneous QHS  . insulin aspart  0-9 Units Subcutaneous TID WC  . insulin glargine  8 Units Subcutaneous QHS  . sodium chloride  3 mL Intravenous Q12H   Continuous Infusions: . sodium chloride 75 mL/hr at 02/04/15 0544    Assessment/Plan:  1. Severe sepsis with hypotension and acute renal failure. This is presumed urinary source. The patient did not urinate until after antibiotics were started. So for nothing growing out of the urine culture. Change antibiotics to Rocephin IV. Hypotension resolved not on any medications at this point the lower blood pressure. 2. Acute renal failure likely ATN with sepsis and hypotension. Creatinine worsening on a daily basis. Nephrology following and is decided to start hemodialysis. 3. Type 2 diabetes mellitus without complication. Patient on low dose Lantus and sliding scale at this point 4. Weakness- will get physical therapy evaluation 5. Morbid obesity weight loss needed  Code Status:     Code Status Orders        Start     Ordered   02/01/15 1953  Full code   Continuous     02/01/15 1953    Code Status History    Date Active Date Inactive Code Status Order ID Comments User Context   This patient has a current code status but no historical code status.     Advance Directive Documentation        Most Recent Value   Type of Advance Directive  Healthcare Power of Attorney, Living will   Pre-existing out of facility DNR order (yellow form or pink MOST form)     "MOST" Form in Place?       Family Communication: Family at bedside Disposition Plan: Home once creatinine improves  Consultants:  Nephrology  Antibiotics:  Rocephin  Time spent: 25 minutes  Loletha Grayer  [facility]

## 2015-02-05 LAB — BASIC METABOLIC PANEL
Anion gap: 12 (ref 5–15)
BUN: 69 mg/dL — ABNORMAL HIGH (ref 6–20)
CALCIUM: 8.3 mg/dL — AB (ref 8.9–10.3)
CO2: 20 mmol/L — AB (ref 22–32)
CREATININE: 7.66 mg/dL — AB (ref 0.61–1.24)
Chloride: 103 mmol/L (ref 101–111)
GFR, EST AFRICAN AMERICAN: 7 mL/min — AB (ref >60)
GFR, EST NON AFRICAN AMERICAN: 6 mL/min — AB (ref >60)
GLUCOSE: 165 mg/dL — AB (ref 65–99)
Potassium: 4 mmol/L (ref 3.5–5.1)
Sodium: 135 mmol/L (ref 135–145)

## 2015-02-05 LAB — GLUCOSE, CAPILLARY
GLUCOSE-CAPILLARY: 130 mg/dL — AB (ref 65–99)
GLUCOSE-CAPILLARY: 156 mg/dL — AB (ref 65–99)
Glucose-Capillary: 167 mg/dL — ABNORMAL HIGH (ref 65–99)
Glucose-Capillary: 200 mg/dL — ABNORMAL HIGH (ref 65–99)

## 2015-02-05 MED ORDER — INSULIN GLARGINE 100 UNIT/ML ~~LOC~~ SOLN
6.0000 [IU] | Freq: Every day | SUBCUTANEOUS | Status: DC
  Administered 2015-02-05 – 2015-02-12 (×8): 6 [IU] via SUBCUTANEOUS
  Filled 2015-02-05 (×9): qty 0.06

## 2015-02-05 NOTE — Progress Notes (Signed)
[facility]  ROUNDING NOTE   Subjective:  Pt had HD yesterday after placement of L IJ temporary dialysis catheter.  Overall feeling about the same. Daughter at bedside.  Urine outpt seems to have increase but azotemia persists.    Objective:  Vital signs in last 24 hours:  Temp:  [97.9 F (36.6 C)-99 F (37.2 C)] 97.9 F (36.6 C) (01/22 0553) Pulse Rate:  [76-78] 78 (01/22 0553) Resp:  [18-22] 22 (01/22 0553) BP: (115-154)/(61-85) 115/61 mmHg (01/22 0553) SpO2:  [93 %-98 %] 93 % (01/22 0553) Weight:  [202.576 kg (446 lb 9.6 oz)] 202.576 kg (446 lb 9.6 oz) (01/22 0155)  Weight change:  Filed Weights   02/01/15 1419 02/01/15 2132 02/05/15 0155  Weight: 160.12 kg (353 lb) 195.9 kg (431 lb 14.1 oz) 202.576 kg (446 lb 9.6 oz)    Intake/Output: I/O last 3 completed shifts: In: 2163 [P.O.:360; I.V.:1718; IV Piggyback:85] Out: [id number] [Urine:2050; Other:1000]   Intake/Output this shift:  Total I/O In: 1947 [I.V.:1947] Out: 350 [Urine:350]  Physical Exam: General: Obese, NAD  Head: Normocephalic, atraumatic. Moist oral mucosal membranes  Eyes: Anicteric  Neck: Supple, trachea midline  Lungs:  Clear to auscultation, normal effort  Heart: Regular rate and rhythm  Abdomen:  Soft, mild lower abdominal tenderness, BS present  Extremities:  1+ peripheral edema.  Neurologic: Nonfocal, moving all four extremities  Skin: No lesions  Access: L IJ temporary dialysis catheter    Basic Metabolic Panel:  Recent Labs Lab 02/01/15 1709 02/02/15 0211 02/03/15 0651 02/04/15 0445 02/05/15 0543  NA 134* 138 128* 134* 135  K 4.6 3.9 3.9 4.2 4.0  CL 97* 102 98* 103 103  CO2 22 24 19* 20* 20*  GLUCOSE 251* 86 169* 142* 165*  BUN 35* 38* 57* 70* 69*  CREATININE 3.64* 3.84* 6.48* 7.65* 7.66*  CALCIUM 8.8* 8.1* 7.6* 8.1* 8.3*    Liver Function Tests: No results for input(s): AST, ALT, ALKPHOS, BILITOT, PROT, ALBUMIN in the last 168 hours. No results for input(s):  LIPASE, AMYLASE in the last 168 hours. No results for input(s): AMMONIA in the last 168 hours.  CBC:  Recent Labs Lab 02/01/15 1709 02/02/15 0211 02/03/15 0651 02/04/15 0445  WBC 26.8* 19.5* 15.9* 14.4*  NEUTROABS 25.0*  --   --   --   HGB 11.5* 11.1* 11.0* 10.4*  HCT 35.6* 34.1* 33.3* 31.2*  MCV 89.0 90.1 87.5 87.2  PLT 148* 128* 125* 129*    Cardiac Enzymes:  Recent Labs Lab 02/01/15 1709 02/02/15 1537  TROPONINI 0.42* 0.52*    BNP: Invalid input(s): POCBNP  CBG:  Recent Labs Lab 02/04/15 1627 02/04/15 2122 02/05/15 0156 02/05/15 0728 02/05/15 1111  GLUCAP 138* 149* 130* 167* 156*    Microbiology: Results for orders placed or performed during the hospital encounter of 02/01/15  Blood Culture (routine x 2)     Status: None (Preliminary result)   Collection Time: 02/01/15  6:35 PM  Result Value Ref Range Status   Specimen Description BLOOD LEFT ARM  Final   Special Requests BOTTLES DRAWN AEROBIC AND ANAEROBIC 5ML  Final   Culture NO GROWTH 4 DAYS  Final   Report Status PENDING  Incomplete  Blood Culture (routine x 2)     Status: None (Preliminary result)   Collection Time: 02/01/15  6:40 PM  Result Value Ref Range Status   Specimen Description BLOOD RIGHT HAND  Final   Special Requests BOTTLES DRAWN AEROBIC AND ANAEROBIC 5ML  Final  Culture NO GROWTH 4 DAYS  Final   Report Status PENDING  Incomplete  MRSA PCR Screening     Status: None   Collection Time: 02/01/15  9:37 PM  Result Value Ref Range Status   MRSA by PCR NEGATIVE NEGATIVE Final    Comment:        The GeneXpert MRSA Assay (FDA approved for NASAL specimens only), is one component of a comprehensive MRSA colonization surveillance program. It is not intended to diagnose MRSA infection nor to guide or monitor treatment for MRSA infections.   Urine culture     Status: None   Collection Time: 02/02/15 12:11 AM  Result Value Ref Range Status   Specimen Description URINE, RANDOM  Final    Special Requests Normal  Final   Culture NO GROWTH 2 DAYS  Final   Report Status 02/04/2015 FINAL  Final    Coagulation Studies: No results for input(s): LABPROT, INR in the last 72 hours.  Urinalysis: No results for input(s): COLORURINE, LABSPEC, PHURINE, GLUCOSEU, HGBUR, BILIRUBINUR, KETONESUR, PROTEINUR, UROBILINOGEN, NITRITE, LEUKOCYTESUR in the last 72 hours.  Invalid input(s): APPERANCEUR    Imaging: Dg Chest 1 View  02/04/2015  CLINICAL DATA:  Central line placement EXAM: CHEST  1 VIEW COMPARISON:  02/01/2015 FINDINGS: Cardiac shadow is stable. Central vascular congestion is noted. No focal infiltrate is seen. A left jugular central line is noted with the catheter tip in the proximal superior vena cava. No pneumothorax is noted. IMPRESSION: Status post central line placement as described above. No pneumothorax is seen. Increased vascular congestion is noted centrally. Electronically Signed   By: Inez Catalina M.D.   On: 02/04/2015 15:56     Medications:     . aspirin  325 mg Oral Daily  . cefTRIAXone (ROCEPHIN)  IV  1 g Intravenous Q24H  . heparin  5,000 Units Subcutaneous 3 times per day  . insulin aspart  0-5 Units Subcutaneous QHS  . insulin aspart  0-9 Units Subcutaneous TID WC  . insulin glargine  6 Units Subcutaneous QHS  . sodium chloride  3 mL Intravenous Q12H   sodium chloride, sodium chloride, acetaminophen **OR** acetaminophen, alteplase, pentafluoroprop-tetrafluoroeth, zolpidem  Assessment/ Plan:  75 y.o. male  with a PMHx of diabetes mellitus, hypertension, rheumatoid arthritis, congenitally atrophic right kidney who was admitted to [facility] on 02/01/2015 for evaluation of shortness of breath and lower abdominal pain.   1. Acute renal failure/proteinuria. Patient presented with poor by mouth intake and severe urinary tract infection.Ultrasound was negative for hydronephrosis. He is known to have a congenitally absent/very atrophic right kidney. Baseline creatinine  1.1. Negative SPEP/UPEP/ANCA antibodies/GBM antibodies -  Pt had HD yesterday, we will plan for an additional treatment today as azotemia persists.  May need an HD treatment tomorrow as well however UOP is now improving.  2. Urinary tract infection. Pyuria and hematuria noted. TNTC RBCs/WBCs,  Urine culture remains negative however.  Continue ceftriaxone for now.    3. Anemia unspecified.  Hemoglobin down to 10.4. Platelets also low at 129,000. SPEP and UPEP negative.   LOS: 4 Nashali Ditmer 1/22/20171:04 PM

## 2015-02-05 NOTE — Evaluation (Signed)
Physical Therapy Evaluation Patient Details Name: Dakota Estes MRN: [id number] DOB: 05-31-1940 Today's Date: 02/05/2015   History of Present Illness  75 yo male with onset of  UTI sepsis was admitted and has developed weakness of LE's that is compromising abiltiy to walk.  Clinical Impression  Pt is demonstrating significant loss of O2 sat with effort but recovers to 93% on room air.  Will need inpt rehab to restore his strength in LE's and endurance component for mobility.  He will have family support to make transition to home afterward successful.      Follow Up Recommendations SNF    Equipment Recommendations  None recommended by PT    Recommendations for Other Services Rehab consult     Precautions / Restrictions Precautions Precautions: None Restrictions Weight Bearing Restrictions: No      Mobility  Bed Mobility               General bed mobility comments: up when PT entered  Transfers Overall transfer level: Needs assistance Equipment used: Rolling walker (2 wheeled);1 person hand held assist Transfers: Sit to/from [facility] Sit to Stand: Min assist Stand pivot transfers: Min assist       General transfer comment: cued hand placement  Ambulation/Gait Ambulation/Gait assistance: Min assist;Min guard Ambulation Distance (Feet): 30 Feet Assistive device: Rolling walker (2 wheeled);1 person hand held assist Gait Pattern/deviations: Wide base of support;Shuffle;Drifts right/left;Decreased stride length;Step-through pattern;Step-to pattern Gait velocity: halting  Gait velocity interpretation: Below normal speed for age/gender General Gait Details: Pt slides walker suddenly to the right and left with no explanation to PT of why when asked  Stairs            Wheelchair Mobility    Modified Rankin (Stroke Patients Only)       Balance Overall balance assessment: Needs assistance Sitting-balance support: Feet  supported Sitting balance-Leahy Scale: Normal   Postural control: Posterior lean Standing balance support: Bilateral upper extremity supported Standing balance-Leahy Scale: Fair                               Pertinent Vitals/Pain Pain Assessment: No/denies pain    Home Living Family/patient expects to be discharged to:: Skilled nursing facility                      Prior Function Level of Independence: Independent         Comments: owns no equipment     Hand Dominance   Dominant Hand: Right    Extremity/Trunk Assessment   Upper Extremity Assessment: Overall WFL for tasks assessed           Lower Extremity Assessment: Generalized weakness      Cervical / Trunk Assessment: Normal  Communication   Communication: No difficulties  Cognition Arousal/Alertness: Awake/alert Behavior During Therapy: WFL for tasks assessed/performed Overall Cognitive Status: Within Functional Limits for tasks assessed                      General Comments General comments (skin integrity, edema, etc.): Pt is getting up to chair and walker with minimal help but unsteady to move.  Has a step to enter house and his wife will not be able to get him there if he cannot do it.    Exercises        Assessment/Plan    PT Assessment Patient needs continued PT services  PT  Diagnosis Difficulty walking   PT Problem List Decreased strength;Decreased range of motion;Decreased activity tolerance;Decreased balance;Decreased mobility;Decreased coordination;Decreased knowledge of use of DME;Decreased safety awareness;Cardiopulmonary status limiting activity;Obesity  PT Treatment Interventions DME instruction;Gait training;Stair training;Functional mobility training;Therapeutic exercise;Therapeutic activities;Balance training;Neuromuscular re-education;Patient/family education   PT Goals (Current goals can be found in the Care Plan section) Acute Rehab PT Goals Patient  Stated Goal: To walk more independently PT Goal Formulation: With patient/family Time For Goal Achievement: 02/19/15 Potential to Achieve Goals: Good    Frequency Min 2X/week   Barriers to discharge Inaccessible home environment;Decreased caregiver support (one person assist)      Co-evaluation               End of Session   Activity Tolerance: Patient tolerated treatment well;Patient limited by fatigue;Treatment limited secondary to medical complications (Comment) (O2 sats 75% with walking, back to 93% baseline) Patient left: in chair;with call bell/phone within reach;with family/visitor present Nurse Communication: Mobility status;Other (comment) (O2 sats)         Time: 1201-1228 PT Time Calculation (min) (ACUTE ONLY): 27 min   Charges:   PT Evaluation $PT Eval Low Complexity: 1 Procedure PT Treatments $Gait Training: 8-22 mins   PT G Codes:        Ramond Dial February 16, 2015, 1:47 PM   Mee Hives, PT MS Acute Rehab Dept. Number: [phone] [id number] and [place] [phone]

## 2015-02-05 NOTE — Progress Notes (Signed)
Patient ID: Dakota Estes, male   DOB: [birth year], 75 y.o.   MRN: [id number] [facility] PROGRESS NOTE  Dakota Estes [id number] DOB: [birth year] DOA: 02/01/2015 PCP: No primary care provider on file.  HPI/Subjective: Patient offers no complaints. Had dialysis last night about an hour. We will repeat dialysis today.  Objective: Filed Vitals:   02/05/15 0553 02/05/15 1321  BP: 115/61 156/73  Pulse: 78 78  Temp: 97.9 F (36.6 C) 97.4 F (36.3 C)  Resp: 22 20    Filed Weights   02/01/15 1419 02/01/15 2132 02/05/15 0155  Weight: 160.12 kg (353 lb) 195.9 kg (431 lb 14.1 oz) 202.576 kg (446 lb 9.6 oz)    ROS: Review of Systems  Constitutional: Negative for fever and chills.  Eyes: Negative for blurred vision.  Respiratory: Negative for cough and shortness of breath.   Cardiovascular: Negative for chest pain.  Gastrointestinal: Negative for nausea, vomiting, abdominal pain, diarrhea and constipation.  Genitourinary: Negative for dysuria.  Musculoskeletal: Negative for joint pain.  Neurological: Negative for dizziness and headaches.   Exam: Physical Exam  Constitutional: He is oriented to person, place, and time.  HENT:  Nose: No mucosal edema.  Mouth/Throat: No oropharyngeal exudate or posterior oropharyngeal edema.  Eyes: Conjunctivae, EOM and lids are normal. Pupils are equal, round, and reactive to light.  Neck: No JVD present. Carotid bruit is not present. No edema present. No thyroid mass and no thyromegaly present.  Cardiovascular: S1 normal and S2 normal.  Exam reveals no gallop.   No murmur heard. Pulses:      Dorsalis pedis pulses are 2+ on the right side, and 2+ on the left side.  Respiratory: No respiratory distress. He has no wheezes. He has no rhonchi. He has no rales.  GI: Soft. Bowel sounds are normal. There is no tenderness.  Musculoskeletal:       Right ankle: He exhibits swelling.       Left ankle: He exhibits swelling.   Lymphadenopathy:    He has no cervical adenopathy.  Neurological: He is alert and oriented to person, place, and time. No cranial nerve deficit.  Skin: Skin is warm. Nails show no clubbing.  Chronic lower extremity discoloration  Psychiatric: He has a normal mood and affect.    Data Reviewed: Basic Metabolic Panel:  Recent Labs Lab 02/01/15 1709 02/02/15 0211 02/03/15 0651 02/04/15 0445 02/05/15 0543  NA 134* 138 128* 134* 135  K 4.6 3.9 3.9 4.2 4.0  CL 97* 102 98* 103 103  CO2 22 24 19* 20* 20*  GLUCOSE 251* 86 169* 142* 165*  BUN 35* 38* 57* 70* 69*  CREATININE 3.64* 3.84* 6.48* 7.65* 7.66*  CALCIUM 8.8* 8.1* 7.6* 8.1* 8.3*   CBC:  Recent Labs Lab 02/01/15 1709 02/02/15 0211 02/03/15 0651 02/04/15 0445  WBC 26.8* 19.5* 15.9* 14.4*  NEUTROABS 25.0*  --   --   --   HGB 11.5* 11.1* 11.0* 10.4*  HCT 35.6* 34.1* 33.3* 31.2*  MCV 89.0 90.1 87.5 87.2  PLT 148* 128* 125* 129*   Cardiac Enzymes:  Recent Labs Lab 02/01/15 1709 02/02/15 1537  TROPONINI 0.42* 0.52*   BNP (last 3 results)  Recent Labs  02/01/15 1709  BNP 223.0*    CBG:  Recent Labs Lab 02/04/15 1627 02/04/15 2122 02/05/15 0156 02/05/15 0728 02/05/15 1111  GLUCAP 138* 149* 130* 167* 156*    Recent Results (from the past 240 hour(s))  Blood Culture (routine x 2)  Status: None (Preliminary result)   Collection Time: 02/01/15  6:35 PM  Result Value Ref Range Status   Specimen Description BLOOD LEFT ARM  Final   Special Requests BOTTLES DRAWN AEROBIC AND ANAEROBIC 5ML  Final   Culture NO GROWTH 4 DAYS  Final   Report Status PENDING  Incomplete  Blood Culture (routine x 2)     Status: None (Preliminary result)   Collection Time: 02/01/15  6:40 PM  Result Value Ref Range Status   Specimen Description BLOOD RIGHT HAND  Final   Special Requests BOTTLES DRAWN AEROBIC AND ANAEROBIC 5ML  Final   Culture NO GROWTH 4 DAYS  Final   Report Status PENDING  Incomplete  MRSA PCR Screening      Status: None   Collection Time: 02/01/15  9:37 PM  Result Value Ref Range Status   MRSA by PCR NEGATIVE NEGATIVE Final    Comment:        The GeneXpert MRSA Assay (FDA approved for NASAL specimens only), is one component of a comprehensive MRSA colonization surveillance program. It is not intended to diagnose MRSA infection nor to guide or monitor treatment for MRSA infections.   Urine culture     Status: None   Collection Time: 02/02/15 12:11 AM  Result Value Ref Range Status   Specimen Description URINE, RANDOM  Final   Special Requests Normal  Final   Culture NO GROWTH 2 DAYS  Final   Report Status 02/04/2015 FINAL  Final     Scheduled Meds: . aspirin  325 mg Oral Daily  . cefTRIAXone (ROCEPHIN)  IV  1 g Intravenous Q24H  . heparin  5,000 Units Subcutaneous 3 times per day  . insulin aspart  0-5 Units Subcutaneous QHS  . insulin aspart  0-9 Units Subcutaneous TID WC  . insulin glargine  6 Units Subcutaneous QHS  . sodium chloride  3 mL Intravenous Q12H    Assessment/Plan:  1. Severe sepsis with hypotension and acute renal failure. This is presumed urinary source. The patient did not urinate until after antibiotics were started. So for nothing growing out of the urine culture. Change antibiotics to Rocephin IV. Hypotension resolved not on any medications at this point the lower blood pressure. 2. Acute renal failure likely ATN with sepsis and hypotension. Creatinine stabilized this morning. But patient had one hour dialysis last night. Patient to have dialysis again today. Check creatinine on the daily basis 3. Type 2 diabetes mellitus without complication. Patient on low dose Lantus and sliding scale at this point 4. Weakness- will get physical therapy evaluation 5. Morbid obesity weight loss needed  Code Status:     Code Status Orders        Start     Ordered   02/01/15 1953  Full code   Continuous     02/01/15 1953    Code Status History    Date Active  Date Inactive Code Status Order ID Comments User Context   This patient has a current code status but no historical code status.    Advance Directive Documentation        Most Recent Value   Type of Advance Directive  Healthcare Power of Attorney, Living will   Pre-existing out of facility DNR order (yellow form or pink MOST form)     "MOST" Form in Place?       Family Communication: Family at bedside Disposition Plan: Home once creatinine improves  Consultants:  Nephrology  Antibiotics:  Rocephin  Time spent: 24 minutes  Dakota Estes  [facility] Hospital[facility]

## 2015-02-05 NOTE — Plan of Care (Signed)
Problem: Acute Rehab PT Goals(only PT should resolve) Goal: Pt Will Ambulate With stable O2 sats on O2 as ordered

## 2015-02-05 NOTE — Plan of Care (Signed)
Problem: Fluid Volume: Goal: Ability to maintain a balanced intake and output will improve Outcome: Progressing Pt started on Hemodialysis 02/05/15 via left IJ temporary catheter; pt able to void throughout the shat  Problem: Nutrition: Goal: Adequate nutrition will be maintained Outcome: Progressing Pt's current diet - heart healthy and carb modified diet; no renal diet nor fluid restrictions at present

## 2015-02-06 LAB — HEPATITIS B CORE ANTIBODY, TOTAL: HEP B C TOTAL AB: NEGATIVE

## 2015-02-06 LAB — BASIC METABOLIC PANEL
ANION GAP: 11 (ref 5–15)
BUN: 60 mg/dL — AB (ref 6–20)
CALCIUM: 8.4 mg/dL — AB (ref 8.9–10.3)
CO2: 24 mmol/L (ref 22–32)
CREATININE: 7.09 mg/dL — AB (ref 0.61–1.24)
Chloride: 102 mmol/L (ref 101–111)
GFR calc Af Amer: 8 mL/min — ABNORMAL LOW (ref >60)
GFR calc non Af Amer: 7 mL/min — ABNORMAL LOW (ref >60)
GLUCOSE: 172 mg/dL — AB (ref 65–99)
POTASSIUM: 4.7 mmol/L (ref 3.5–5.1)
SODIUM: 137 mmol/L (ref 135–145)

## 2015-02-06 LAB — CULTURE, BLOOD (ROUTINE X 2)
CULTURE: NO GROWTH
Culture: NO GROWTH

## 2015-02-06 LAB — PROTEIN ELECTRO, RANDOM URINE
Albumin ELP, Urine: 50.1 %
Alpha-1-Globulin, U: 4.6 %
Alpha-2-Globulin, U: 9.2 %
Beta Globulin, U: 19.6 %
GAMMA GLOBULIN, U: 16.5 %
Total Protein, Urine: 322.8 mg/dL

## 2015-02-06 LAB — GLUCOSE, CAPILLARY
GLUCOSE-CAPILLARY: 133 mg/dL — AB (ref 65–99)
Glucose-Capillary: 173 mg/dL — ABNORMAL HIGH (ref 65–99)
Glucose-Capillary: 140 mg/dL — ABNORMAL HIGH (ref 65–99)
Glucose-Capillary: 128 mg/dL — ABNORMAL HIGH (ref 65–99)

## 2015-02-06 LAB — HEPATITIS C ANTIBODY: HCV Ab: <0.1 s/co ratio (ref 0.0–0.9)

## 2015-02-06 LAB — HEPATITIS B SURFACE ANTIBODY, QUANTITATIVE: Hepatitis B-Post: <3.1 m[IU]/mL — ABNORMAL LOW (ref >9.9)

## 2015-02-06 LAB — HEPATITIS B SURFACE ANTIGEN: Hepatitis B Surface Ag: NEGATIVE

## 2015-02-06 MED ORDER — POLYETHYLENE GLYCOL 3350 17 G PO PACK
17.0000 g | PACK | Freq: Every day | ORAL | Status: DC
  Administered 2015-02-07 – 2015-02-12 (×3): 17 g via ORAL
  Filled 2015-02-06 (×5): qty 1

## 2015-02-06 MED ORDER — TAMSULOSIN HCL 0.4 MG PO CAPS
0.4000 mg | ORAL_CAPSULE | Freq: Every day | ORAL | Status: DC
  Administered 2015-02-06 – 2015-02-12 (×7): 0.4 mg via ORAL
  Filled 2015-02-06 (×7): qty 1

## 2015-02-06 NOTE — Progress Notes (Signed)
Pt is alert and oriented, denies pain, sleeping in between care, up to bsc with one assist, good appeite, dialysis not performed today. BUN and creatinine remain elevated, receiving IV antibiotics, afebrile.

## 2015-02-06 NOTE — Progress Notes (Signed)
Patient ID: Dakota Estes, male   DOB: 03/12/1940, 75 y.o.   MRN: [id number] [facility] PROGRESS NOTE  Dakota Estes [id number] DOB: Nov 22, 1940 DOA: 02/01/2015 PCP: No primary care provider on file.  HPI/Subjective: Patient feels good and offers no complaints.  Objective: Filed Vitals:   02/06/15 0505 02/06/15 1227  BP: 144/87 144/74  Pulse: 86 72  Temp: 98 F (36.7 C) 98.3 F (36.8 C)  Resp: 20 20    Filed Weights   02/01/15 2132 02/05/15 0155 02/05/15 1625  Weight: 195.9 kg (431 lb 14.1 oz) 202.576 kg (446 lb 9.6 oz) 204 kg (449 lb 11.8 oz)    ROS: Review of Systems  Constitutional: Negative for fever and chills.  Eyes: Negative for blurred vision.  Respiratory: Negative for cough and shortness of breath.   Cardiovascular: Negative for chest pain.  Gastrointestinal: Negative for nausea, vomiting, abdominal pain, diarrhea and constipation.  Genitourinary: Negative for dysuria.  Musculoskeletal: Negative for joint pain.  Neurological: Negative for dizziness and headaches.   Exam: Physical Exam  Constitutional: He is oriented to person, place, and time.  HENT:  Nose: No mucosal edema.  Mouth/Throat: No oropharyngeal exudate or posterior oropharyngeal edema.  Eyes: Conjunctivae, EOM and lids are normal. Pupils are equal, round, and reactive to light.  Neck: No JVD present. Carotid bruit is not present. No edema present. No thyroid mass and no thyromegaly present.  Cardiovascular: S1 normal and S2 normal.  Exam reveals no gallop.   No murmur heard. Pulses:      Dorsalis pedis pulses are 2+ on the right side, and 2+ on the left side.  Respiratory: No respiratory distress. He has no wheezes. He has no rhonchi. He has no rales.  GI: Soft. Bowel sounds are normal. There is no tenderness.  Musculoskeletal:       Right ankle: He exhibits swelling.       Left ankle: He exhibits swelling.  Lymphadenopathy:    He has no cervical adenopathy.   Neurological: He is alert and oriented to person, place, and time. No cranial nerve deficit.  Skin: Skin is warm. Nails show no clubbing.  Chronic lower extremity discoloration  Psychiatric: He has a normal mood and affect.    Data Reviewed: Basic Metabolic Panel:  Recent Labs Lab 02/02/15 0211 02/03/15 0651 02/04/15 0445 02/05/15 0543 02/06/15 0528  NA 138 128* 134* 135 137  K 3.9 3.9 4.2 4.0 4.7  CL 102 98* 103 103 102  CO2 24 19* 20* 20* 24  GLUCOSE 86 169* 142* 165* 172*  BUN 38* 57* 70* 69* 60*  CREATININE 3.84* 6.48* 7.65* 7.66* 7.09*  CALCIUM 8.1* 7.6* 8.1* 8.3* 8.4*   CBC:  Recent Labs Lab 02/01/15 1709 02/02/15 0211 02/03/15 0651 02/04/15 0445  WBC 26.8* 19.5* 15.9* 14.4*  NEUTROABS 25.0*  --   --   --   HGB 11.5* 11.1* 11.0* 10.4*  HCT 35.6* 34.1* 33.3* 31.2*  MCV 89.0 90.1 87.5 87.2  PLT 148* 128* 125* 129*   Cardiac Enzymes:  Recent Labs Lab 02/01/15 1709 02/02/15 1537  TROPONINI 0.42* 0.52*   BNP (last 3 results)  Recent Labs  02/01/15 1709  BNP 223.0*    CBG:  Recent Labs Lab 02/05/15 0728 02/05/15 1111 02/05/15 2207 02/06/15 0745 02/06/15 1128  GLUCAP 167* 156* 200* 173* 133*    Recent Results (from the past 240 hour(s))  Blood Culture (routine x 2)     Status: None   Collection Time:  02/01/15  6:35 PM  Result Value Ref Range Status   Specimen Description BLOOD LEFT ARM  Final   Special Requests BOTTLES DRAWN AEROBIC AND ANAEROBIC 5ML  Final   Culture NO GROWTH 5 DAYS  Final   Report Status 02/06/2015 FINAL  Final  Blood Culture (routine x 2)     Status: None   Collection Time: 02/01/15  6:40 PM  Result Value Ref Range Status   Specimen Description BLOOD RIGHT HAND  Final   Special Requests BOTTLES DRAWN AEROBIC AND ANAEROBIC 5ML  Final   Culture NO GROWTH 5 DAYS  Final   Report Status 02/06/2015 FINAL  Final  MRSA PCR Screening     Status: None   Collection Time: 02/01/15  9:37 PM  Result Value Ref Range Status    MRSA by PCR NEGATIVE NEGATIVE Final    Comment:        The GeneXpert MRSA Assay (FDA approved for NASAL specimens only), is one component of a comprehensive MRSA colonization surveillance program. It is not intended to diagnose MRSA infection nor to guide or monitor treatment for MRSA infections.   Urine culture     Status: None   Collection Time: 02/02/15 12:11 AM  Result Value Ref Range Status   Specimen Description URINE, RANDOM  Final   Special Requests Normal  Final   Culture NO GROWTH 2 DAYS  Final   Report Status 02/04/2015 FINAL  Final     Scheduled Meds: . aspirin  325 mg Oral Daily  . cefTRIAXone (ROCEPHIN)  IV  1 g Intravenous Q24H  . heparin  5,000 Units Subcutaneous 3 times per day  . insulin aspart  0-5 Units Subcutaneous QHS  . insulin aspart  0-9 Units Subcutaneous TID WC  . insulin glargine  6 Units Subcutaneous QHS  . sodium chloride  3 mL Intravenous Q12H  . tamsulosin  0.4 mg Oral QPC supper    Assessment/Plan:  1. Severe sepsis with hypotension and acute renal failure. This is presumed urinary source. The patient did not urinate until after antibiotics were started. Empiric Rocephin. So far urine culture negative. Hypotension resolved. 2. Acute renal failure likely ATN with sepsis and hypotension. Patient had two dialysis sessions. 3. Type 2 diabetes mellitus without complication. Patient on low dose Lantus and sliding scale at this point. 4. Weakness- physical therapy evaluation 5. Morbid obesity weight loss needed 6. Likely BPH- start Flomax  Code Status:     Code Status Orders        Start     Ordered   02/01/15 1953  Full code   Continuous     02/01/15 1953    Code Status History    Date Active Date Inactive Code Status Order ID Comments User Context   This patient has a current code status but no historical code status.    Advance Directive Documentation        Most Recent Value   Type of Advance Directive  Healthcare Power of  Attorney, Living will   Pre-existing out of facility DNR order (yellow form or pink MOST form)     "MOST" Form in Place?       Family Communication: Family at bedside Disposition Plan: Home once creatinine improves  Consultants:  Nephrology  Antibiotics:  Rocephin  Time spent: 22 minutes  [place], [facility]

## 2015-02-06 NOTE — Care Management (Signed)
Admitted to [facility] with the diagnosis of sepsis. Lives with daughter. Daughter  is [name] [phone]). No home health. Takes care of both basic and instrumental activities of daily living himself, drives. Uses no aids for ambulation. Last seen [facility] in November. No established dialysis. No falls. Appetite is usually hardy. Seen Orthopedic surgeon at [place] 4-5 months ago. Had jell injections in both knees.  Daughter is questioning possible sleep apnea. Was being worked up for sleep apnea, but didn't finish the studies.  Temporary Dialysis Cath placed 02/04/15 BUN 60, Creatinine 7.09 today. Physical therapy evaluation completed. Recommends skilled nursing facility. Mr Box sleeping. Daughter is not sure which discharge her father would chose. Skilled Nursing vs Home with [place] RN MSN CCM Care Management [phone]

## 2015-02-06 NOTE — Progress Notes (Signed)
Pt requests stool softner, per MD start miralax daily, Dr. Leslye Peer via telephone.

## 2015-02-06 NOTE — Care Management Important Message (Signed)
Important Message  Patient Details  Name: Dakota Estes MRN: [id number] Date of Birth: 08-20-1940   Medicare Important Message Given:  Yes    [name] A Whittney Steenson 02/06/2015, 2:05 PM

## 2015-02-06 NOTE — Progress Notes (Signed)
[facility]  ROUNDING NOTE   Subjective:   Hemodialysis for last two days. Third treatment held today due to increased urine output. UOP 1580. 1 litre UF     Objective:  Vital signs in last 24 hours:  Temp:  [97.1 F (36.2 C)-99.3 F (37.4 C)] 98.3 F (36.8 C) (01/23 1227) Pulse Rate:  [72-86] 72 (01/23 1227) Resp:  [18-24] 20 (01/23 1227) BP: (144-166)/(69-87) 144/74 mmHg (01/23 1227) SpO2:  [92 %-99 %] 99 % (01/23 1227) Weight:  [204 kg (449 lb 11.8 oz)] 204 kg (449 lb 11.8 oz) (01/22 1625)  Weight change: 1.423 kg (3 lb 2.2 oz) Filed Weights   02/01/15 2132 02/05/15 0155 02/05/15 1625  Weight: 195.9 kg (431 lb 14.1 oz) 202.576 kg (446 lb 9.6 oz) 204 kg (449 lb 11.8 oz)    Intake/Output: I/O last 3 completed shifts: In: 2884 [I.V.:2847; IV Piggyback:37] Out: [id number] [Urine:2530; Other:2000]   Intake/Output this shift:  Total I/O In: 50 [IV Piggyback:50] Out: 200 [Urine:200]  Physical Exam: General: Obese, NAD  Head: Normocephalic, atraumatic. Moist oral mucosal membranes  Eyes: Anicteric  Neck: Supple, trachea midline  Lungs:  Clear to auscultation, normal effort  Heart: Regular rate and rhythm  Abdomen:  Soft, mild lower abdominal tenderness, BS present  Extremities:  1+ peripheral edema.  Neurologic: Nonfocal, moving all four extremities  Skin: No lesions  Access: L IJ temporary dialysis catheter Dr. Delana Meyer [id number]    Basic Metabolic Panel:  Recent Labs Lab 02/02/15 0211 02/03/15 [id number] 02/04/15 0445 02/05/15 0543 02/06/15 0528  NA 138 128* 134* 135 137  K 3.9 3.9 4.2 4.0 4.7  CL 102 98* 103 103 102  CO2 24 19* 20* 20* 24  GLUCOSE 86 169* 142* 165* 172*  BUN 38* 57* 70* 69* 60*  CREATININE 3.84* 6.48* 7.65* 7.66* 7.09*  CALCIUM 8.1* 7.6* 8.1* 8.3* 8.4*    Liver Function Tests: No results for input(s): AST, ALT, ALKPHOS, BILITOT, PROT, ALBUMIN in the last 168 hours. No results for input(s): LIPASE, AMYLASE in the last 168  hours. No results for input(s): AMMONIA in the last 168 hours.  CBC:  Recent Labs Lab 02/01/15 1709 02/02/15 0211 02/03/15 0651 02/04/15 0445  WBC 26.8* 19.5* 15.9* 14.4*  NEUTROABS 25.0*  --   --   --   HGB 11.5* 11.1* 11.0* 10.4*  HCT 35.6* 34.1* 33.3* 31.2*  MCV 89.0 90.1 87.5 87.2  PLT 148* 128* 125* 129*    Cardiac Enzymes:  Recent Labs Lab 02/01/15 1709 02/02/15 1537  TROPONINI 0.42* 0.52*    BNP: Invalid input(s): POCBNP  CBG:  Recent Labs Lab 02/05/15 0728 02/05/15 1111 02/05/15 2207 02/06/15 0745 02/06/15 1128  GLUCAP 167* 156* 200* 173* 133*    Microbiology: Results for orders placed or performed during the hospital encounter of 02/01/15  Blood Culture (routine x 2)     Status: None   Collection Time: 02/01/15  6:35 PM  Result Value Ref Range Status   Specimen Description BLOOD LEFT ARM  Final   Special Requests BOTTLES DRAWN AEROBIC AND ANAEROBIC 5ML  Final   Culture NO GROWTH 5 DAYS  Final   Report Status 02/06/2015 FINAL  Final  Blood Culture (routine x 2)     Status: None   Collection Time: 02/01/15  6:40 PM  Result Value Ref Range Status   Specimen Description BLOOD RIGHT HAND  Final   Special Requests BOTTLES DRAWN AEROBIC AND ANAEROBIC 5ML  Final   Culture NO  GROWTH 5 DAYS  Final   Report Status 02/06/2015 FINAL  Final  MRSA PCR Screening     Status: None   Collection Time: 02/01/15  9:37 PM  Result Value Ref Range Status   MRSA by PCR NEGATIVE NEGATIVE Final    Comment:        The GeneXpert MRSA Assay (FDA approved for NASAL specimens only), is one component of a comprehensive MRSA colonization surveillance program. It is not intended to diagnose MRSA infection nor to guide or monitor treatment for MRSA infections.   Urine culture     Status: None   Collection Time: 02/02/15 12:11 AM  Result Value Ref Range Status   Specimen Description URINE, RANDOM  Final   Special Requests Normal  Final   Culture NO GROWTH 2 DAYS   Final   Report Status 02/04/2015 FINAL  Final    Coagulation Studies: No results for input(s): LABPROT, INR in the last 72 hours.  Urinalysis: No results for input(s): COLORURINE, LABSPEC, PHURINE, GLUCOSEU, HGBUR, BILIRUBINUR, KETONESUR, PROTEINUR, UROBILINOGEN, NITRITE, LEUKOCYTESUR in the last 72 hours.  Invalid input(s): APPERANCEUR    Imaging: Dg Chest 1 View  02/04/2015  CLINICAL DATA:  Central line placement EXAM: CHEST  1 VIEW COMPARISON:  02/01/2015 FINDINGS: Cardiac shadow is stable. Central vascular congestion is noted. No focal infiltrate is seen. A left jugular central line is noted with the catheter tip in the proximal superior vena cava. No pneumothorax is noted. IMPRESSION: Status post central line placement as described above. No pneumothorax is seen. Increased vascular congestion is noted centrally. Electronically Signed   By: Inez Catalina M.D.   On: 02/04/2015 15:56     Medications:     . aspirin  325 mg Oral Daily  . cefTRIAXone (ROCEPHIN)  IV  1 g Intravenous Q24H  . heparin  5,000 Units Subcutaneous 3 times per day  . insulin aspart  0-5 Units Subcutaneous QHS  . insulin aspart  0-9 Units Subcutaneous TID WC  . insulin glargine  6 Units Subcutaneous QHS  . sodium chloride  3 mL Intravenous Q12H  . tamsulosin  0.4 mg Oral QPC supper   sodium chloride, sodium chloride, acetaminophen **OR** acetaminophen, alteplase, pentafluoroprop-tetrafluoroeth, zolpidem  Assessment/ Plan:  75 y.o. male  with a PMHx of diabetes mellitus, hypertension, rheumatoid arthritis, congenitally atrophic right kidney who was admitted to [facility] on 02/01/2015 for evaluation of shortness of breath and lower abdominal pain.   1. Acute renal failure/proteinuria. Patient presented with poor by mouth intake and severe urinary tract infection.Ultrasound was negative for hydronephrosis. He is known to have a congenitally absent/very atrophic right kidney. Baseline creatinine 1.1. Negative  SPEP/UPEP/ANCA antibodies/GBM antibodies 2 treatments of hemodialysis. Tolerated treatment well. Now with nonoliguric urine output.  Will monitor daily for dialysis need.   2. Urinary tract infection. Pyuria and hematuria noted. TNTC RBCs/WBCs,  Urine culture remains negative however.   Continue ceftriaxone   3. Anemia with acute renal failure: .  Hemoglobin 10.4. Platelets low at 129,000. SPEP and UPEP negative. - no epo administered.   4. Hypertension: blood pressure now at goal.  - holding ramipril due to acute renal failure.   5. 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    LOS: [place], [place] 1/23/20173:11 PM

## 2015-02-06 NOTE — Plan of Care (Signed)
Problem: Physical Regulation: Goal: Signs and symptoms of infection will decrease Outcome: Progressing Continues ABX.  Problem: Safety: Goal: Ability to remain free from injury will improve Outcome: Progressing On high fall precaution per policy.  Problem: Pain Managment: Goal: General experience of comfort will improve Outcome: Progressing No complaints of pain.  Problem: Activity: Goal: Risk for activity intolerance will decrease Outcome: Progressing A hemodialysis pt, continues to void, uses urinal at bedside.

## 2015-02-07 LAB — CBC
HEMATOCRIT: 31.8 % — AB (ref 40.0–52.0)
Hemoglobin: 10.6 g/dL — ABNORMAL LOW (ref 13.0–18.0)
MCH: 28 pg (ref 26.0–34.0)
MCHC: 33.2 g/dL (ref 32.0–36.0)
MCV: 84.4 fL (ref 80.0–100.0)
PLATELETS: 176 10*3/uL (ref 150–440)
RBC: 3.77 MIL/uL — ABNORMAL LOW (ref 4.40–5.90)
RDW: 13.5 % (ref 11.5–14.5)
WBC: 15.9 10*3/uL — AB (ref 3.8–10.6)

## 2015-02-07 LAB — BASIC METABOLIC PANEL
Anion gap: 12 (ref 5–15)
BUN: 61 mg/dL — AB (ref 6–20)
CALCIUM: 8.5 mg/dL — AB (ref 8.9–10.3)
CHLORIDE: 102 mmol/L (ref 101–111)
CO2: 23 mmol/L (ref 22–32)
CREATININE: 7.58 mg/dL — AB (ref 0.61–1.24)
GFR, EST AFRICAN AMERICAN: 7 mL/min — AB (ref >60)
GFR, EST NON AFRICAN AMERICAN: 6 mL/min — AB (ref >60)
Glucose, Bld: 168 mg/dL — ABNORMAL HIGH (ref 65–99)
Potassium: 4.7 mmol/L (ref 3.5–5.1)
SODIUM: 137 mmol/L (ref 135–145)

## 2015-02-07 LAB — GLUCOSE, CAPILLARY
GLUCOSE-CAPILLARY: 144 mg/dL — AB (ref 65–99)
GLUCOSE-CAPILLARY: 166 mg/dL — AB (ref 65–99)
GLUCOSE-CAPILLARY: 148 mg/dL — AB (ref 65–99)
Glucose-Capillary: 147 mg/dL — ABNORMAL HIGH (ref 65–99)

## 2015-02-07 NOTE — Progress Notes (Signed)
Patient ID: Dakota Estes, male   DOB: 1940-03-25, 75 y.o.   MRN: [id number] [facility] PROGRESS NOTE  NICKI SCHNIDER [id number] DOB: [birth year] DOA: 02/01/2015 PCP: No primary care provider on file.  HPI/Subjective: Patient feels good and offers no complaints.  Objective: Filed Vitals:   02/06/15 2110 02/07/15 0504  BP: 146/88 151/79  Pulse: 85 80  Temp: 98.4 F (36.9 C) 97.9 F (36.6 C)  Resp: 20 20    Filed Weights   02/01/15 2132 02/05/15 0155 02/05/15 1625  Weight: 195.9 kg (431 lb 14.1 oz) 202.576 kg (446 lb 9.6 oz) 204 kg (449 lb 11.8 oz)    ROS: Review of Systems  Constitutional: Negative for fever and chills.  Eyes: Negative for blurred vision.  Respiratory: Negative for cough and shortness of breath.   Cardiovascular: Negative for chest pain.  Gastrointestinal: Negative for nausea, vomiting, abdominal pain, diarrhea and constipation.  Genitourinary: Negative for dysuria.  Musculoskeletal: Negative for joint pain.  Neurological: Negative for dizziness and headaches.   Exam: Physical Exam  Constitutional: He is oriented to person, place, and time.  HENT:  Nose: No mucosal edema.  Mouth/Throat: No oropharyngeal exudate or posterior oropharyngeal edema.  Eyes: Conjunctivae, EOM and lids are normal. Pupils are equal, round, and reactive to light.  Neck: No JVD present. Carotid bruit is not present. No edema present. No thyroid mass and no thyromegaly present.  Cardiovascular: S1 normal and S2 normal.  Exam reveals no gallop.   No murmur heard. Pulses:      Dorsalis pedis pulses are 2+ on the right side, and 2+ on the left side.  Respiratory: No respiratory distress. He has no wheezes. He has no rhonchi. He has no rales.  GI: Soft. Bowel sounds are normal. There is no tenderness.  Musculoskeletal:       Right ankle: He exhibits swelling.       Left ankle: He exhibits swelling.  Lymphadenopathy:    He has no cervical adenopathy.   Neurological: He is alert and oriented to person, place, and time. No cranial nerve deficit.  Skin: Skin is warm. Nails show no clubbing.  Chronic lower extremity discoloration  Psychiatric: He has a normal mood and affect.    Data Reviewed: Basic Metabolic Panel:  Recent Labs Lab 02/03/15 0651 02/04/15 0445 02/05/15 0543 02/06/15 0528 02/07/15 0625  NA 128* 134* 135 137 137  K 3.9 4.2 4.0 4.7 4.7  CL 98* 103 103 102 102  CO2 19* 20* 20* 24 23  GLUCOSE 169* 142* 165* 172* 168*  BUN 57* 70* 69* 60* 61*  CREATININE 6.48* 7.65* 7.66* 7.09* 7.58*  CALCIUM 7.6* 8.1* 8.3* 8.4* 8.5*   CBC:  Recent Labs Lab 02/01/15 1709 02/02/15 0211 02/03/15 0651 02/04/15 0445 02/07/15 0625  WBC 26.8* 19.5* 15.9* 14.4* 15.9*  NEUTROABS 25.0*  --   --   --   --   HGB 11.5* 11.1* 11.0* 10.4* 10.6*  HCT 35.6* 34.1* 33.3* 31.2* 31.8*  MCV 89.0 90.1 87.5 87.2 84.4  PLT 148* 128* 125* 129* 176   Cardiac Enzymes:  Recent Labs Lab 02/01/15 1709 02/02/15 1537  TROPONINI 0.42* 0.52*   BNP (last 3 results)  Recent Labs  02/01/15 1709  BNP 223.0*    CBG:  Recent Labs Lab 02/06/15 1128 02/06/15 1703 02/06/15 2138 02/07/15 0728 02/07/15 1115  GLUCAP 133* 140* 128* 144* 166*    Recent Results (from the past 240 hour(s))  Blood Culture (routine x 2)  Status: None   Collection Time: 02/01/15  6:35 PM  Result Value Ref Range Status   Specimen Description BLOOD LEFT ARM  Final   Special Requests BOTTLES DRAWN AEROBIC AND ANAEROBIC 5ML  Final   Culture NO GROWTH 5 DAYS  Final   Report Status 02/06/2015 FINAL  Final  Blood Culture (routine x 2)     Status: None   Collection Time: 02/01/15  6:40 PM  Result Value Ref Range Status   Specimen Description BLOOD RIGHT HAND  Final   Special Requests BOTTLES DRAWN AEROBIC AND ANAEROBIC 5ML  Final   Culture NO GROWTH 5 DAYS  Final   Report Status 02/06/2015 FINAL  Final  MRSA PCR Screening     Status: None   Collection Time:  02/01/15  9:37 PM  Result Value Ref Range Status   MRSA by PCR NEGATIVE NEGATIVE Final    Comment:        The GeneXpert MRSA Assay (FDA approved for NASAL specimens only), is one component of a comprehensive MRSA colonization surveillance program. It is not intended to diagnose MRSA infection nor to guide or monitor treatment for MRSA infections.   Urine culture     Status: None   Collection Time: 02/02/15 12:11 AM  Result Value Ref Range Status   Specimen Description URINE, RANDOM  Final   Special Requests Normal  Final   Culture NO GROWTH 2 DAYS  Final   Report Status 02/04/2015 FINAL  Final     Scheduled Meds: . aspirin  325 mg Oral Daily  . cefTRIAXone (ROCEPHIN)  IV  1 g Intravenous Q24H  . heparin  5,000 Units Subcutaneous 3 times per day  . insulin aspart  0-5 Units Subcutaneous QHS  . insulin aspart  0-9 Units Subcutaneous TID WC  . insulin glargine  6 Units Subcutaneous QHS  . polyethylene glycol  17 g Oral Daily  . sodium chloride  3 mL Intravenous Q12H  . tamsulosin  0.4 mg Oral QPC supper    Assessment/Plan:  1. Severe sepsis with hypotension and acute renal failure. This is presumed urinary source. The patient did not urinate until after antibiotics were started. Stop abx. 2. Acute renal failureon CKD stage 3-  likely ATN with sepsis and hypotension. Patient had two dialysis sessions. Creatinine still very impaired. Nephrology to decide on further dialysis or not. Care Manager to look into LTach facilities. 3. Type 2 diabetes mellitus without complication. Patient on low dose Lantus and sliding scale at this point. 4. Weakness- physical therapy evaluation rec rehab 5. Morbid obesity weight loss needed 6. Likely BPH- start Flomax  Code Status:     Code Status Orders        Start     Ordered   02/01/15 1953  Full code   Continuous     02/01/15 1953    Code Status History    Date Active Date Inactive Code Status Order ID Comments User Context   This  patient has a current code status but no historical code status.    Advance Directive Documentation        Most Recent Value   Type of Advance Directive  Healthcare Power of Attorney, Living will   Pre-existing out of facility DNR order (yellow form or pink MOST form)     "MOST" Form in Place?       Family Communication: Family at bedside Disposition Plan: Potential Ltach  Consultants:  Nephrology  Time spent: 24 minutes  Leslye Peer,  [facility]

## 2015-02-07 NOTE — Care Management (Signed)
Discussed the possibility of Mr. Guzzardo going to [facility]  For continued care. Dr. Leslye Peer discussed with Mr. Meave and daughter at the bedside. Both are in agreement. Telephone call to [place] representative for [employer]. Mr. Smucker insurance is [employer]. This policy requires a 3 day stay in ICU to be considered for transferred to [employer]. Will update Dr. Leslye Peer and the [place] family. Shelbie Ammons RN MSN CCM Care Management [phone]

## 2015-02-07 NOTE — Progress Notes (Signed)
Problem: Physical Regulation: Goal: Signs and symptoms of infection will decrease Outcome: Progressing Continues ABX.  Problem: Safety: Goal: Ability to remain free from injury will improve Outcome: Progressing On high fall precaution per policy.  Problem: Pain Managment: Goal: General experience of comfort will improve Outcome: Progressing No complaints of pain.  Problem: Activity: Goal: Risk for activity intolerance will decrease Outcome: Progressing A hemodialysis pt, continues to void, uses urinal at bedside.

## 2015-02-07 NOTE — Progress Notes (Signed)
Physical Therapy Treatment Patient Details Name: Dakota Estes MRN: [id number] DOB: 1940-09-07 Today's Date: 02/23/15    History of Present Illness 75 yo male with onset of  UTI sepsis was admitted and has developed weakness of LE's that is compromising abiltiy to walk.    PT Comments    Pt is making gradual progress towards goals, however does not want to ambulate this date. He says he has been ambulating in room with nursing staff and just got back to bed. As pt has temp IJ cath, discussion with Dr. Juleen China and has been cleared to ambulate with therapy. Pt very motivated to perform bed there-ex and shows good strength. Further assessment planned for mobility as needs change.  Follow Up Recommendations  SNF     Equipment Recommendations       Recommendations for Other Services       Precautions / Restrictions Precautions Precautions: None Restrictions Weight Bearing Restrictions: No    Mobility  Bed Mobility               General bed mobility comments: refused all mobility as he just got back into bed  Transfers                    Ambulation/Gait                 Stairs            Wheelchair Mobility    Modified Rankin (Stroke Patients Only)       Balance                                    Cognition Arousal/Alertness: Awake/alert Behavior During Therapy: WFL for tasks assessed/performed Overall Cognitive Status: Within Functional Limits for tasks assessed                      Exercises Other Exercises Other Exercises: Supine ther-ex performed including B quad sets, SLRs, heel slides, hip abd/add, B UE resisted shoulder flexion and bicep curls. All ther-ex performed x 10 reps with supervision and assist as needed. Pt tends to hold breath during exercises and cued for exhalation during concentric motion.    General Comments        Pertinent Vitals/Pain Pain Assessment: No/denies pain    Home  Living                      Prior Function            PT Goals (current goals can now be found in the care plan section) Acute Rehab PT Goals Patient Stated Goal: To walk more independently PT Goal Formulation: With patient Time For Goal Achievement: 02/19/15 Potential to Achieve Goals: Good Progress towards PT goals: Progressing toward goals    Frequency  Min 2X/week    PT Plan Current plan remains appropriate    Co-evaluation             End of Session   Activity Tolerance: Patient tolerated treatment well Patient left: in bed;with bed alarm set     Time: [id number] PT Time Calculation (min) (ACUTE ONLY): 14 min  Charges:  $Therapeutic Exercise: 8-22 mins                    G Codes:      Param Capri 02/23/15, 4:52 PM  Greggory Stallion, PT, DPT  [phone]  

## 2015-02-07 NOTE — Progress Notes (Signed)
[facility]  ROUNDING NOTE   Subjective:   Daughter at bedside.  UOP 1200 Creatinine and BUN rising. Held dialysis since Saturday.   Objective:  Vital signs in last 24 hours:  Temp:  [97.9 F (36.6 C)-98.4 F (36.9 C)] 97.9 F (36.6 C) (01/24 0504) Pulse Rate:  [80-85] 80 (01/24 0504) Resp:  [20] 20 (01/24 0504) BP: (146-151)/(79-88) 151/79 mmHg (01/24 0504) SpO2:  [98 %] 98 % (01/24 0504)  Weight change:  Filed Weights   02/01/15 2132 02/05/15 0155 02/05/15 1625  Weight: 195.9 kg (431 lb 14.1 oz) 202.576 kg (446 lb 9.6 oz) 204 kg (449 lb 11.8 oz)    Intake/Output: I/O last 3 completed shifts: In: 21 [IV Piggyback:50] Out: 2000 [Urine:2000]   Intake/Output this shift:  Total I/O In: 720 [P.O.:720] Out: -   Physical Exam: General: Obese, NAD  Head: Normocephalic, atraumatic. Moist oral mucosal membranes  Eyes: Anicteric  Neck: Supple, trachea midline  Lungs:  Clear to auscultation, normal effort  Heart: Regular rate and rhythm  Abdomen:  Soft, mild lower abdominal tenderness, BS present  Extremities:  1+ peripheral edema.  Neurologic: Nonfocal, moving all four extremities  Skin: No lesions  Access: L IJ temporary dialysis catheter Dr. Delana Meyer [id number]    Basic Metabolic Panel:  Recent Labs Lab 02/03/15 [id number] 02/04/15 0445 02/05/15 0543 02/06/15 0528 02/07/15 0625  NA 128* 134* 135 137 137  K 3.9 4.2 4.0 4.7 4.7  CL 98* 103 103 102 102  CO2 19* 20* 20* 24 23  GLUCOSE 169* 142* 165* 172* 168*  BUN 57* 70* 69* 60* 61*  CREATININE 6.48* 7.65* 7.66* 7.09* 7.58*  CALCIUM 7.6* 8.1* 8.3* 8.4* 8.5*    Liver Function Tests: No results for input(s): AST, ALT, ALKPHOS, BILITOT, PROT, ALBUMIN in the last 168 hours. No results for input(s): LIPASE, AMYLASE in the last 168 hours. No results for input(s): AMMONIA in the last 168 hours.  CBC:  Recent Labs Lab 02/01/15 1709 02/02/15 0211 02/03/15 0651 02/04/15 0445 02/07/15 0625  WBC 26.8*  19.5* 15.9* 14.4* 15.9*  NEUTROABS 25.0*  --   --   --   --   HGB 11.5* 11.1* 11.0* 10.4* 10.6*  HCT 35.6* 34.1* 33.3* 31.2* 31.8*  MCV 89.0 90.1 87.5 87.2 84.4  PLT 148* 128* 125* 129* 176    Cardiac Enzymes:  Recent Labs Lab 02/01/15 1709 02/02/15 1537  TROPONINI 0.42* 0.52*    BNP: Invalid input(s): POCBNP  CBG:  Recent Labs Lab 02/06/15 1128 02/06/15 1703 02/06/15 2138 02/07/15 0728 02/07/15 1115  GLUCAP 133* 140* 128* 144* 81*    Microbiology: Results for orders placed or performed during the hospital encounter of 02/01/15  Blood Culture (routine x 2)     Status: None   Collection Time: 02/01/15  6:35 PM  Result Value Ref Range Status   Specimen Description BLOOD LEFT ARM  Final   Special Requests BOTTLES DRAWN AEROBIC AND ANAEROBIC 5ML  Final   Culture NO GROWTH 5 DAYS  Final   Report Status 02/06/2015 FINAL  Final  Blood Culture (routine x 2)     Status: None   Collection Time: 02/01/15  6:40 PM  Result Value Ref Range Status   Specimen Description BLOOD RIGHT HAND  Final   Special Requests BOTTLES DRAWN AEROBIC AND ANAEROBIC 5ML  Final   Culture NO GROWTH 5 DAYS  Final   Report Status 02/06/2015 FINAL  Final  MRSA PCR Screening     Status:  None   Collection Time: 02/01/15  9:37 PM  Result Value Ref Range Status   MRSA by PCR NEGATIVE NEGATIVE Final    Comment:        The GeneXpert MRSA Assay (FDA approved for NASAL specimens only), is one component of a comprehensive MRSA colonization surveillance program. It is not intended to diagnose MRSA infection nor to guide or monitor treatment for MRSA infections.   Urine culture     Status: None   Collection Time: 02/02/15 12:11 AM  Result Value Ref Range Status   Specimen Description URINE, RANDOM  Final   Special Requests Normal  Final   Culture NO GROWTH 2 DAYS  Final   Report Status 02/04/2015 FINAL  Final    Coagulation Studies: No results for input(s): LABPROT, INR in the last 72  hours.  Urinalysis: No results for input(s): COLORURINE, LABSPEC, PHURINE, GLUCOSEU, HGBUR, BILIRUBINUR, KETONESUR, PROTEINUR, UROBILINOGEN, NITRITE, LEUKOCYTESUR in the last 72 hours.  Invalid input(s): APPERANCEUR    Imaging: No results found.   Medications:     . aspirin  325 mg Oral Daily  . cefTRIAXone (ROCEPHIN)  IV  1 g Intravenous Q24H  . heparin  5,000 Units Subcutaneous 3 times per day  . insulin aspart  0-5 Units Subcutaneous QHS  . insulin aspart  0-9 Units Subcutaneous TID WC  . insulin glargine  6 Units Subcutaneous QHS  . polyethylene glycol  17 g Oral Daily  . sodium chloride  3 mL Intravenous Q12H  . tamsulosin  0.4 mg Oral QPC supper   sodium chloride, sodium chloride, acetaminophen **OR** acetaminophen, alteplase, pentafluoroprop-tetrafluoroeth, zolpidem  Assessment/ Plan:  75 y.o. male  with a PMHx of diabetes mellitus, hypertension, rheumatoid arthritis, congenitally atrophic right kidney who was admitted to [facility] on 02/01/2015 for evaluation of shortness of breath and lower abdominal pain.   1. Acute renal failure with proteinuria: no evidence of chronic kidney disease prior to admission.  Patient presented with poor by mouth intake and severe urinary tract infection. Ultrasound was negative for hydronephrosis. He is known to have a congenitally absent/very atrophic right kidney. Baseline creatinine 1.1. Negative SPEP/UPEP/ANCA antibodies/GBM antibodies He has completed 2 treatments of hemodialysis. Tolerated treatment well. Now with nonoliguric urine output.  Will monitor daily for dialysis need.  Continue hold dialysis and monitor for recovery.   2. Urinary tract infection. Pyuria and hematuria noted. TNTC RBCs/WBCs,  Urine culture no growth Continue ceftriaxone   3. Anemia with acute renal failure:  Hemoglobin 10.6. Platelets improved SPEP and UPEP negative. - no epo administered.   4. Hypertension: blood pressure now at goal.  - holding ramipril due  to acute renal failure.   5. 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    LOS: [place], [place] 1/24/20171:43 PM

## 2015-02-07 NOTE — Clinical Documentation Improvement (Signed)
Internal Medicine  Can the diagnosis of CKD be further specified? Coders/CDI may not assume any diagnosis. Please document response in next progress note; NOT in BPA drop down box. Thanks!   CKD Stage I - GFR greater than or equal to 90  CKD Stage II - GFR 60-89  CKD Stage III - GFR 30-59  CKD Stage IV - GFR 15-29  CKD Stage V - GFR < 15  ESRD (End Stage Renal Disease)  Other condition  Unable to clinically determine  Supporting Information: : (risk factors, signs and symptoms, diagnostics, treatment)  Black male  GFR's running for current admission are 7 - 8  Please exercise your independent, professional judgment when responding. A specific answer is not anticipated or expected.  Thank You, Zoila Shutter RN, BSN, [place] [phone]; [phone]

## 2015-02-08 ENCOUNTER — Encounter
Admission: EM | Disposition: A | Discharge status: Skilled Nursing Facility | Payer: Self-pay | Source: Home / Self Care | Attending: Internal Medicine

## 2015-02-08 HISTORY — PX: PERIPHERAL VASCULAR CATHETERIZATION: SHX172C

## 2015-02-08 LAB — BASIC METABOLIC PANEL
Anion gap: 12 (ref 5–15)
BUN: 59 mg/dL — AB (ref 6–20)
CHLORIDE: 97 mmol/L — AB (ref 101–111)
CO2: 23 mmol/L (ref 22–32)
CREATININE: 7.55 mg/dL — AB (ref 0.61–1.24)
Calcium: 8.6 mg/dL — ABNORMAL LOW (ref 8.9–10.3)
GFR calc Af Amer: 7 mL/min — ABNORMAL LOW (ref >60)
GFR calc non Af Amer: 6 mL/min — ABNORMAL LOW (ref >60)
Glucose, Bld: 173 mg/dL — ABNORMAL HIGH (ref 65–99)
POTASSIUM: 4 mmol/L (ref 3.5–5.1)
Sodium: 132 mmol/L — ABNORMAL LOW (ref 135–145)

## 2015-02-08 LAB — GLUCOSE, CAPILLARY
GLUCOSE-CAPILLARY: 154 mg/dL — AB (ref 65–99)
GLUCOSE-CAPILLARY: 181 mg/dL — AB (ref 65–99)
GLUCOSE-CAPILLARY: 195 mg/dL — AB (ref 65–99)
GLUCOSE-CAPILLARY: 177 mg/dL — AB (ref 65–99)
Glucose-Capillary: 143 mg/dL — ABNORMAL HIGH (ref 65–99)

## 2015-02-08 SURGERY — DIALYSIS/PERMA CATHETER REPAIR
Anesthesia: Moderate Sedation

## 2015-02-08 SURGERY — DIALYSIS/PERMA CATHETER INSERTION
Laterality: Right

## 2015-02-08 MED ORDER — MIDAZOLAM HCL 5 MG/5ML IJ SOLN
INTRAMUSCULAR | Status: AC
  Filled 2015-02-08: qty 5

## 2015-02-08 MED ORDER — FENTANYL CITRATE (PF) 100 MCG/2ML IJ SOLN
50.0000 ug | Freq: Once | INTRAMUSCULAR | Status: AC
  Administered 2015-02-08: 50 ug via INTRAVENOUS

## 2015-02-08 MED ORDER — HEPARIN (PORCINE) IN NACL 2-0.9 UNIT/ML-% IJ SOLN
INTRAMUSCULAR | Status: AC
  Filled 2015-02-08: qty 500

## 2015-02-08 MED ORDER — LIDOCAINE-EPINEPHRINE (PF) 1 %-1:200000 IJ SOLN
INTRAMUSCULAR | Status: AC
  Filled 2015-02-08: qty 30

## 2015-02-08 MED ORDER — HEPARIN SODIUM (PORCINE) 10000 UNIT/ML IJ SOLN
INTRAMUSCULAR | Status: AC
  Filled 2015-02-08: qty 1

## 2015-02-08 MED ORDER — FENTANYL CITRATE (PF) 100 MCG/2ML IJ SOLN
INTRAMUSCULAR | Status: AC
  Filled 2015-02-08: qty 2

## 2015-02-08 MED ORDER — MIDAZOLAM HCL 2 MG/2ML IJ SOLN
INTRAMUSCULAR | Status: DC | PRN
Start: 2015-02-08 — End: 2015-02-08
  Administered 2015-02-08 (×2): 2 mg via INTRAVENOUS

## 2015-02-08 MED ORDER — SODIUM CHLORIDE 0.9 % IJ SOLN
INTRAMUSCULAR | Status: AC
  Filled 2015-02-08: qty 15

## 2015-02-08 SURGICAL SUPPLY — 3 items
CATH PALINDROME RT-P 15FX19CM (CATHETERS) ×4 IMPLANT
PACK ANGIOGRAPHY (CUSTOM PROCEDURE TRAY) ×4 IMPLANT
TOWEL OR 17X26 4PK STRL BLUE (TOWEL DISPOSABLE) ×4 IMPLANT

## 2015-02-08 NOTE — Plan of Care (Signed)
Pt had dialysis perm cath placed today in R chest.  Temp cath remains in L IJ.  Dialysis will be determined on day to day basis.  Creatinine and BUN continue to be very elevated at 7.55 and 59 respectively. Pt's FBS stable.  Not much of an appetite.  Pt remained safe during shift and was up for a time to chair.

## 2015-02-08 NOTE — Progress Notes (Signed)
PT Cancellation Note  Patient Details Name: Dakota Estes MRN: [id number] DOB: [birth year]   Cancelled Treatment:      Pt refusal today as pt states had "bad" night.  Will re-attempt at later time when appropriate.    Naeema Patlan 02/08/2015, 10:32 AM  Latravis Grine, PTA

## 2015-02-08 NOTE — Care Management Important Message (Signed)
Important Message  Patient Details  Name: Dakota Estes MRN: [id number] Date of Birth: [birth year]   Medicare Important Message Given:  Yes    [name] A Zhane Donlan 02/08/2015, 11:33 AM

## 2015-02-08 NOTE — Progress Notes (Signed)
Initial Nutrition Assessment   INTERVENTION:   Meals and Snacks: Cater to patient preferences to optimize nutritional intake   NUTRITION DIAGNOSIS:   Increased nutrient needs related to acute illness as evidenced by estimated needs.  GOAL:   Patient will meet greater than or equal to 90% of their needs  MONITOR:    (Energy Intake, Electrolyte and Renal Profile, Glucose Profile, Anthropometrics)  REASON FOR ASSESSMENT:   LOS    ASSESSMENT:   Pt admitted with SOB secondary to acute renal failure and sepsis. Pt s/p 2 dialysis sessions currently holding dialysis and monitoring renal function and UOP per Nephrology. Per MD note pt in procedures getting a vascular cath placed this afternoon   Past Medical History  Diagnosis Date  . Diabetes mellitus without complication ([place])   . Congenital absence of one kidney   . Hypertension   . Rheumatoid arthritis ([place])     Diet Order:  Diet NPO time specified   Current Nutrition: Pt very sleepy/groggy this morning reports not sleeping hardly at all last night. Pt reports only eating a banana before falling asleep.   Food/Nutrition-Related History: Pt reports good appetite PTA and during admission.   Scheduled Medications:  . [MAR Hold] aspirin  325 mg Oral Daily  . [MAR Hold] heparin  5,000 Units Subcutaneous 3 times per day  . [MAR Hold] insulin aspart  0-5 Units Subcutaneous QHS  . [MAR Hold] insulin aspart  0-9 Units Subcutaneous TID WC  . [MAR Hold] insulin glargine  6 Units Subcutaneous QHS  . [MAR Hold] polyethylene glycol  17 g Oral Daily  . [MAR Hold] sodium chloride  3 mL Intravenous Q12H  . [MAR Hold] tamsulosin  0.4 mg Oral QPC supper     Electrolyte/Renal Profile and Glucose Profile:   Recent Labs Lab 02/06/15 0528 02/07/15 0625 02/08/15 0645  NA 137 137 132*  K 4.7 4.7 4.0  CL 102 102 97*  CO2 24 23 23   BUN 60* 61* 59*  CREATININE 7.09* 7.58* 7.55*  CALCIUM 8.4* 8.5* 8.6*  GLUCOSE 172* 168* 173*    Protein Profile: No results for input(s): ALBUMIN in the last 168 hours.  Gastrointestinal Profile: Last BM:  02/06/2015  UOP: 739mL this am, 452mL out last 24 hours HD: 1.5L UF on 1/22  Nutrition-Focused Physical Exam Findings:  Unable to complete Nutrition-Focused physical exam at this time.     Weight Change: Per [facility] weight encounters weight of 431lbs recorded on 02/01/2015 and 449lbs on 02/05/2015. Filed Weights   02/05/15 1625 02/08/15 1132 02/08/15 1352  Weight: 449 lb 11.8 oz (204 kg) 423 lb 7 oz (192.07 kg) 423 lb (191.872 kg)     Height:   Ht Readings from Last 1 Encounters:  02/08/15 6\' 5"  (1.956 m)    Weight:   Wt Readings from Last 1 Encounters:  02/08/15 423 lb (191.872 kg)    Ideal Body Weight:   94.5kg  BMI:  Body mass index is 50.15 kg/(m^2).  Estimated Nutritional Needs:   Kcal:  using IBW of 94.5kg, BEE: 1792kcals, TEE: (IF 1.1-1.3)(AF 1.2) 2365-2796kcals  Protein:  103-123g protein (1.1-1.3g/kg)  Fluid:  UOP+1L  EDUCATION NEEDS:   Education needs no appropriate at this time   [place], RD, LDN Pager [phone] Weekend/On-Call Pager [phone]

## 2015-02-08 NOTE — Progress Notes (Signed)
[facility]  ROUNDING NOTE   Subjective:   Holding dialysis today.  UOP 1200  Objective:  Vital signs in last 24 hours:  Temp:  [98.8 F (37.1 C)-99 F (37.2 C)] 99 F (37.2 C) (01/25 1352) Pulse Rate:  [78-86] 85 (01/25 1352) Resp:  [16-20] 16 (01/25 1352) BP: (110-159)/(53-108) 138/108 mmHg (01/25 1352) SpO2:  [96 %-99 %] 97 % (01/25 1352) Weight:  [191.872 kg (423 lb)-192.07 kg (423 lb 7 oz)] 191.872 kg (423 lb) (01/25 1352)  Weight change:  Filed Weights   02/05/15 1625 02/08/15 1132 02/08/15 1352  Weight: 204 kg (449 lb 11.8 oz) 192.07 kg (423 lb 7 oz) 191.872 kg (423 lb)    Intake/Output: I/O last 3 completed shifts: In: 840 [P.O.:840] Out: 650 [Urine:650]   Intake/Output this shift:  Total I/O In: 3 [I.V.:3] Out: 700 [Urine:700]  Physical Exam: General: Obese, NAD  Head: Normocephalic, atraumatic. Moist oral mucosal membranes  Eyes: Anicteric  Neck: Supple, trachea midline  Lungs:  Clear to auscultation, normal effort  Heart: Regular rate and rhythm  Abdomen:  Soft, mild lower abdominal tenderness, BS present  Extremities: 1+ peripheral edema.  Neurologic: Nonfocal, moving all four extremities  Skin: No lesions  Access: L IJ temporary dialysis catheter Dr. Delana Meyer [id number]    Basic Metabolic Panel:  Recent Labs Lab 02/04/15 0445 02/05/15 0543 02/06/15 0528 02/07/15 0625 02/08/15 0645  NA 134* 135 137 137 132*  K 4.2 4.0 4.7 4.7 4.0  CL 103 103 102 102 97*  CO2 20* 20* 24 23 23   GLUCOSE 142* 165* 172* 168* 173*  BUN 70* 69* 60* 61* 59*  CREATININE 7.65* 7.66* 7.09* 7.58* 7.55*  CALCIUM 8.1* 8.3* 8.4* 8.5* 8.6*    Liver Function Tests: No results for input(s): AST, ALT, ALKPHOS, BILITOT, PROT, ALBUMIN in the last 168 hours. No results for input(s): LIPASE, AMYLASE in the last 168 hours. No results for input(s): AMMONIA in the last 168 hours.  CBC:  Recent Labs Lab 02/01/15 1709 02/02/15 0211 02/03/15 0651 02/04/15 0445  02/07/15 0625  WBC 26.8* 19.5* 15.9* 14.4* 15.9*  NEUTROABS 25.0*  --   --   --   --   HGB 11.5* 11.1* 11.0* 10.4* 10.6*  HCT 35.6* 34.1* 33.3* 31.2* 31.8*  MCV 89.0 90.1 87.5 87.2 84.4  PLT 148* 128* 125* 129* 176    Cardiac Enzymes:  Recent Labs Lab 02/01/15 1709 02/02/15 1537  TROPONINI 0.42* 0.52*    BNP: Invalid input(s): POCBNP  CBG:  Recent Labs Lab 02/07/15 1653 02/07/15 2131 02/08/15 0716 02/08/15 1012 02/08/15 1102  GLUCAP 147* 148* 154* 181* 195*    Microbiology: Results for orders placed or performed during the hospital encounter of 02/01/15  Blood Culture (routine x 2)     Status: None   Collection Time: 02/01/15  6:35 PM  Result Value Ref Range Status   Specimen Description BLOOD LEFT ARM  Final   Special Requests BOTTLES DRAWN AEROBIC AND ANAEROBIC 5ML  Final   Culture NO GROWTH 5 DAYS  Final   Report Status 02/06/2015 FINAL  Final  Blood Culture (routine x 2)     Status: None   Collection Time: 02/01/15  6:40 PM  Result Value Ref Range Status   Specimen Description BLOOD RIGHT HAND  Final   Special Requests BOTTLES DRAWN AEROBIC AND ANAEROBIC 5ML  Final   Culture NO GROWTH 5 DAYS  Final   Report Status 02/06/2015 FINAL  Final  MRSA PCR Screening  Status: None   Collection Time: 02/01/15  9:37 PM  Result Value Ref Range Status   MRSA by PCR NEGATIVE NEGATIVE Final    Comment:        The GeneXpert MRSA Assay (FDA approved for NASAL specimens only), is one component of a comprehensive MRSA colonization surveillance program. It is not intended to diagnose MRSA infection nor to guide or monitor treatment for MRSA infections.   Urine culture     Status: None   Collection Time: 02/02/15 12:11 AM  Result Value Ref Range Status   Specimen Description URINE, RANDOM  Final   Special Requests Normal  Final   Culture NO GROWTH 2 DAYS  Final   Report Status 02/04/2015 FINAL  Final    Coagulation Studies: No results for input(s): LABPROT,  INR in the last 72 hours.  Urinalysis: No results for input(s): COLORURINE, LABSPEC, PHURINE, GLUCOSEU, HGBUR, BILIRUBINUR, KETONESUR, PROTEINUR, UROBILINOGEN, NITRITE, LEUKOCYTESUR in the last 72 hours.  Invalid input(s): APPERANCEUR    Imaging: No results found.   Medications:     . [MAR Hold] aspirin  325 mg Oral Daily  . [MAR Hold] heparin  5,000 Units Subcutaneous 3 times per day  . [MAR Hold] insulin aspart  0-5 Units Subcutaneous QHS  . [MAR Hold] insulin aspart  0-9 Units Subcutaneous TID WC  . [MAR Hold] insulin glargine  6 Units Subcutaneous QHS  . [MAR Hold] polyethylene glycol  17 g Oral Daily  . [MAR Hold] sodium chloride  3 mL Intravenous Q12H  . [MAR Hold] tamsulosin  0.4 mg Oral QPC supper   [MAR Hold] sodium chloride, [MAR Hold] sodium chloride, [MAR Hold] acetaminophen **OR** [MAR Hold] acetaminophen, [MAR Hold] alteplase, midazolam, [MAR Hold] pentafluoroprop-tetrafluoroeth, [MAR Hold] zolpidem  Assessment/ Plan:  74 y.o. male  with a PMHx of diabetes mellitus, hypertension, rheumatoid arthritis, congenitally atrophic right kidney who was admitted to [facility] on 02/01/2015 for evaluation of shortness of breath and lower abdominal pain.   1. Acute renal failure with proteinuria: no evidence of chronic kidney disease prior to admission.  Patient presented with poor by mouth intake and severe urinary tract infection. Ultrasound was negative for hydronephrosis. He is known to have a congenitally absent/very atrophic right kidney. Baseline creatinine 1.1. Negative SPEP/UPEP/ANCA antibodies/GBM antibodies He has completed 2 treatments of hemodialysis. Tolerated treatment well. Now with nonoliguric urine output.  Will monitor daily for dialysis need.  Continue hold dialysis and monitor for recovery.   2. Urinary tract infection. Pyuria and hematuria noted. TNTC RBCs/WBCs,  Urine culture no growth Continue ceftriaxone   3. Anemia with acute renal failure:  Hemoglobin 10.6.  Platelets improved SPEP and UPEP negative. - no epo administered.   4. Hypertension: blood pressure now at goal.  - holding ramipril due to acute renal failure.   5. 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    LOS: 7 Nikeshia Keetch 1/25/20173:17 PM

## 2015-02-08 NOTE — Progress Notes (Signed)
Patient ID: Dakota Estes, male   DOB: August 04, 1940, 75 y.o.   MRN: [id number] [facility] PROGRESS NOTE  Dakota Estes [id number] DOB: 1940-12-04 DOA: 02/01/2015 PCP: No primary care provider on file.  HPI/Subjective: Patient feels okay and offers no complaints. No nausea or vomiting. Tolerating diet. Had 2 bowel movements yesterday.  Objective: Filed Vitals:   02/08/15 1209 02/08/15 1352  BP: 110/53 138/108  Pulse: 78 85  Temp: 98.8 F (37.1 C) 99 F (37.2 C)  Resp: 19 16    Filed Weights   02/05/15 1625 02/08/15 1132 02/08/15 1352  Weight: 204 kg (449 lb 11.8 oz) 192.07 kg (423 lb 7 oz) 191.872 kg (423 lb)    ROS: Review of Systems  Constitutional: Negative for fever and chills.  Eyes: Negative for blurred vision.  Respiratory: Negative for cough and shortness of breath.   Cardiovascular: Negative for chest pain.  Gastrointestinal: Negative for nausea, vomiting, abdominal pain, diarrhea and constipation.  Genitourinary: Negative for dysuria.  Musculoskeletal: Negative for joint pain.  Neurological: Negative for dizziness and headaches.   Exam: Physical Exam  Constitutional: He is oriented to person, place, and time.  HENT:  Nose: No mucosal edema.  Mouth/Throat: No oropharyngeal exudate or posterior oropharyngeal edema.  Eyes: Conjunctivae, EOM and lids are normal. Pupils are equal, round, and reactive to light.  Neck: No JVD present. Carotid bruit is not present. No edema present. No thyroid mass and no thyromegaly present.  Cardiovascular: S1 normal and S2 normal.  Exam reveals no gallop.   No murmur heard. Pulses:      Dorsalis pedis pulses are 2+ on the right side, and 2+ on the left side.  Respiratory: No respiratory distress. He has no wheezes. He has no rhonchi. He has no rales.  GI: Soft. Bowel sounds are normal. There is no tenderness.  Musculoskeletal:       Right ankle: He exhibits swelling.       Left ankle: He exhibits swelling.   Lymphadenopathy:    He has no cervical adenopathy.  Neurological: He is alert and oriented to person, place, and time. No cranial nerve deficit.  Skin: Skin is warm. Nails show no clubbing.  Chronic lower extremity discoloration  Psychiatric: He has a normal mood and affect.    Data Reviewed: Basic Metabolic Panel:  Recent Labs Lab 02/04/15 0445 02/05/15 0543 02/06/15 0528 02/07/15 0625 02/08/15 0645  NA 134* 135 137 137 132*  K 4.2 4.0 4.7 4.7 4.0  CL 103 103 102 102 97*  CO2 20* 20* 24 23 23   GLUCOSE 142* 165* 172* 168* 173*  BUN 70* 69* 60* 61* 59*  CREATININE 7.65* 7.66* 7.09* 7.58* 7.55*  CALCIUM 8.1* 8.3* 8.4* 8.5* 8.6*   CBC:  Recent Labs Lab 02/01/15 1709 02/02/15 0211 02/03/15 0651 02/04/15 0445 02/07/15 0625  WBC 26.8* 19.5* 15.9* 14.4* 15.9*  NEUTROABS 25.0*  --   --   --   --   HGB 11.5* 11.1* 11.0* 10.4* 10.6*  HCT 35.6* 34.1* 33.3* 31.2* 31.8*  MCV 89.0 90.1 87.5 87.2 84.4  PLT 148* 128* 125* 129* 176   Cardiac Enzymes:  Recent Labs Lab 02/01/15 1709 02/02/15 1537  TROPONINI 0.42* 0.52*   BNP (last 3 results)  Recent Labs  02/01/15 1709  BNP 223.0*    CBG:  Recent Labs Lab 02/07/15 1653 02/07/15 2131 02/08/15 0716 02/08/15 1012 02/08/15 1102  GLUCAP 147* 148* 154* 181* 195*    Recent Results (from the  past 240 hour(s))  Blood Culture (routine x 2)     Status: None   Collection Time: 02/01/15  6:35 PM  Result Value Ref Range Status   Specimen Description BLOOD LEFT ARM  Final   Special Requests BOTTLES DRAWN AEROBIC AND ANAEROBIC 5ML  Final   Culture NO GROWTH 5 DAYS  Final   Report Status 02/06/2015 FINAL  Final  Blood Culture (routine x 2)     Status: None   Collection Time: 02/01/15  6:40 PM  Result Value Ref Range Status   Specimen Description BLOOD RIGHT HAND  Final   Special Requests BOTTLES DRAWN AEROBIC AND ANAEROBIC 5ML  Final   Culture NO GROWTH 5 DAYS  Final   Report Status 02/06/2015 FINAL  Final  MRSA  PCR Screening     Status: None   Collection Time: 02/01/15  9:37 PM  Result Value Ref Range Status   MRSA by PCR NEGATIVE NEGATIVE Final    Comment:        The GeneXpert MRSA Assay (FDA approved for NASAL specimens only), is one component of a comprehensive MRSA colonization surveillance program. It is not intended to diagnose MRSA infection nor to guide or monitor treatment for MRSA infections.   Urine culture     Status: None   Collection Time: 02/02/15 12:11 AM  Result Value Ref Range Status   Specimen Description URINE, RANDOM  Final   Special Requests Normal  Final   Culture NO GROWTH 2 DAYS  Final   Report Status 02/04/2015 FINAL  Final     Scheduled Meds: . [MAR Hold] aspirin  325 mg Oral Daily  . [MAR Hold] heparin  5,000 Units Subcutaneous 3 times per day  . [MAR Hold] insulin aspart  0-5 Units Subcutaneous QHS  . [MAR Hold] insulin aspart  0-9 Units Subcutaneous TID WC  . [MAR Hold] insulin glargine  6 Units Subcutaneous QHS  . [MAR Hold] polyethylene glycol  17 g Oral Daily  . [MAR Hold] sodium chloride  3 mL Intravenous Q12H  . [MAR Hold] tamsulosin  0.4 mg Oral QPC supper    Assessment/Plan:  1. Severe sepsis with hypotension and acute renal failure. Antibiotics completed. 2. Acute renal failureon CKD stage 3-  likely ATN with sepsis and hypotension. Patient had two dialysis sessions. Creatinine still very impaired. As per care manager, not a candidate for LTAC facility. Nephrology watching and waiting on whether or not to do dialysis. 3. Type 2 diabetes mellitus without complication. Patient on low dose Lantus and sliding scale at this point. 4. Weakness- physical therapy evaluation rec rehab 5. Morbid obesity weight loss needed 6. Likely BPH- Flomax  Code Status:     Code Status Orders        Start     Ordered   02/01/15 1953  Full code   Continuous     02/01/15 1953    Code Status History    Date Active Date Inactive Code Status Order ID  Comments User Context   This patient has a current code status but no historical code status.    Advance Directive Documentation        Most Recent Value   Type of Advance Directive  Healthcare Power of Attorney, Living will   Pre-existing out of facility DNR order (yellow form or pink MOST form)     "MOST" Form in Place?       Family Communication: Family yesterday Disposition Plan: To be determined depending on his  kidney function  Consultants:  Nephrology  Time spent: 20 minutes  Loletha Grayer  [facility]

## 2015-02-08 NOTE — Op Note (Signed)
OPERATIVE NOTE   PROCEDURE: 1. Insertion of tunneled dialysis catheter right internal jugular approach with ultrasound and fluoroscopic guidance.  PRE-OPERATIVE DIAGNOSIS: Acute on chronic renal failure; diabetes mellitus; congestive heart failure  POST-OPERATIVE DIAGNOSIS: Same  SURGEON: Schnier, Dolores Lory.  ANESTHESIA: Conscious sedation was administered under my direct supervision. IV Versed plus fentanyl were utilized. Continuous ECG, pulse oximetry and blood pressure was monitored throughout the entire procedure. A total of 3 milligrams of Versed and 150 micrograms of fentanyl were utilized.  Conscious sedation was begun at  2:58 PM and concluded at 3:24 PM for a total of 26 minutes.  ESTIMATED BLOOD LOSS: Minimal cc  CONTRAST USED:  None  FLUOROSCOPY TIME:  2 minutes  INDICATIONS:   Dakota Estes a 75 y.o. y.o. male who presents with worsening renal function and CHF. He has been dialyzed via a temperature catheter and now is being converted over to a tunneled catheter. The risks and benefits are reviewed all questions answered patient agrees to proceed..  DESCRIPTION: After obtaining full informed written consent, the patient was positioned supine. The right neck was prepped and draped in a sterile fashion. Ultrasound was placed in a sterile sleeve. Ultrasound was utilized to identify the right internal jugular vein which is noted to be echolucent and compressible indicating patency. Images recorded for the permanent record. Under real-time visualization a Seldinger needle is inserted into the vein and the guidewires advanced without difficulty. Small counterincision was made at the wire insertion site. Dilators are passed over the wire and the tunneled dialysis catheter is fed into the central venous system without difficulty.  Under fluoroscopy the catheter tip positioned at the atrial caval junction. The catheter is then approximated to the chest wall and an exit site  selected. 1% lidocaine is infiltrated in soft tissues at this level small incision is made and the tunneling device is then passed from the exit site to the neck counterincision. Catheter is then connected to the tunneling device and the catheter was pulled subcutaneously. It is then transected and the hub assembly connected without difficulty. Both lumens aspirate and flush easily. After verification of smooth contour with proper tip position under fluoroscopy the catheter is packed with 5000 units of heparin per lumen.  Catheter secured to the skin of the right chest with 0 silk. A sterile dressing is applied with a Biopatch.  COMPLICATIONS: None  CONDITION: Good  Schnier, Dolores Lory [place] [facility]. Office:  [phone]   02/08/2015,3:24 PM

## 2015-02-09 ENCOUNTER — Encounter: Payer: Self-pay | Admitting: Vascular Surgery

## 2015-02-09 LAB — RENAL FUNCTION PANEL
ANION GAP: 10 (ref 5–15)
Albumin: 2.5 g/dL — ABNORMAL LOW (ref 3.5–5.0)
BUN: 57 mg/dL — ABNORMAL HIGH (ref 6–20)
CALCIUM: 8 mg/dL — AB (ref 8.9–10.3)
CHLORIDE: 98 mmol/L — AB (ref 101–111)
CO2: 25 mmol/L (ref 22–32)
CREATININE: 6.94 mg/dL — AB (ref 0.61–1.24)
GFR calc Af Amer: 8 mL/min — ABNORMAL LOW (ref >60)
GFR calc non Af Amer: 7 mL/min — ABNORMAL LOW (ref >60)
GLUCOSE: 175 mg/dL — AB (ref 65–99)
Phosphorus: 5.2 mg/dL — ABNORMAL HIGH (ref 2.5–4.6)
Potassium: 3.7 mmol/L (ref 3.5–5.1)
SODIUM: 133 mmol/L — AB (ref 135–145)

## 2015-02-09 LAB — CBC
HCT: 29 % — ABNORMAL LOW (ref 40.0–52.0)
HEMOGLOBIN: 9.9 g/dL — AB (ref 13.0–18.0)
MCH: 28.8 pg (ref 26.0–34.0)
MCHC: 34.3 g/dL (ref 32.0–36.0)
MCV: 84.1 fL (ref 80.0–100.0)
Platelets: 236 10*3/uL (ref 150–440)
RBC: 3.45 MIL/uL — ABNORMAL LOW (ref 4.40–5.90)
RDW: 14.2 % (ref 11.5–14.5)
WBC: 9.9 10*3/uL (ref 3.8–10.6)

## 2015-02-09 LAB — GLUCOSE, CAPILLARY
GLUCOSE-CAPILLARY: 187 mg/dL — AB (ref 65–99)
GLUCOSE-CAPILLARY: 208 mg/dL — AB (ref 65–99)
GLUCOSE-CAPILLARY: 182 mg/dL — AB (ref 65–99)
Glucose-Capillary: 182 mg/dL — ABNORMAL HIGH (ref 65–99)

## 2015-02-09 NOTE — Progress Notes (Addendum)
Called report to [place].  Pt transferred via bed. Dakota Estes

## 2015-02-09 NOTE — Progress Notes (Signed)
Patient ID: Dakota Estes, male   DOB: November 10, 1940, 75 y.o.   MRN: [id number] Dakota Estes PROGRESS NOTE  Dakota Estes [id number] DOB: 02/11/1940 DOA: 02/01/2015 PCP: No primary care provider on file.  HPI/Subjective: Reports that he is urinating a lot denies any chest pain or shortness of breath    Objective: Filed Vitals:   02/09/15 0444 02/09/15 0928  BP: 137/69 110/62  Pulse: 88 81  Temp: 98.5 F (36.9 C) 98.4 F (36.9 C)  Resp: 18 20    Filed Weights   02/05/15 1625 02/08/15 1132 02/08/15 1352  Weight: 204 kg (449 lb 11.8 oz) 192.07 kg (423 lb 7 oz) 191.872 kg (423 lb)    ROS: Review of Systems  Constitutional: Negative for fever and chills.  Eyes: Negative for blurred vision.  Respiratory: Negative for cough and shortness of breath.   Cardiovascular: Negative for chest pain.  Gastrointestinal: Negative for nausea, vomiting, abdominal pain, diarrhea and constipation.  Genitourinary: Negative for dysuria.  Musculoskeletal: Negative for joint pain.  Neurological: Negative for dizziness and headaches.   Exam: Physical Exam  Constitutional: He is oriented to person, place, and time.  HENT:  Nose: No mucosal edema.  Mouth/Throat: No oropharyngeal exudate or posterior oropharyngeal edema.  Eyes: Conjunctivae, EOM and lids are normal. Pupils are equal, round, and reactive to light.  Neck: No JVD present. Carotid bruit is not present. No edema present. No thyroid mass and no thyromegaly present.  Cardiovascular: S1 normal and S2 normal.  Exam reveals no gallop.   No murmur heard. Pulses:      Dorsalis pedis pulses are 2+ on the right side, and 2+ on the left side.  Respiratory: No respiratory distress. He has no wheezes. He has no rhonchi. He has no rales.  GI: Soft. Bowel sounds are normal. There is no tenderness.  Musculoskeletal:       Right ankle: He exhibits swelling.       Left ankle: He exhibits swelling.  Lymphadenopathy:    He has no  cervical adenopathy.  Neurological: He is alert and oriented to person, place, and time. No cranial nerve deficit.  Skin: Skin is warm. Nails show no clubbing.  Chronic lower extremity discoloration  Psychiatric: He has a normal mood and affect.    Data Reviewed: Basic Metabolic Panel:  Recent Labs Lab 02/04/15 0445 02/05/15 0543 02/06/15 0528 02/07/15 0625 02/08/15 0645  NA 134* 135 137 137 132*  K 4.2 4.0 4.7 4.7 4.0  CL 103 103 102 102 97*  CO2 20* 20* 24 23 23   GLUCOSE 142* 165* 172* 168* 173*  BUN 70* 69* 60* 61* 59*  CREATININE 7.65* 7.66* 7.09* 7.58* 7.55*  CALCIUM 8.1* 8.3* 8.4* 8.5* 8.6*   CBC:  Recent Labs Lab 02/03/15 0651 02/04/15 0445 02/07/15 0625  WBC 15.9* 14.4* 15.9*  HGB 11.0* 10.4* 10.6*  HCT 33.3* 31.2* 31.8*  MCV 87.5 87.2 84.4  PLT 125* 129* 176   Cardiac Enzymes:  Recent Labs Lab 02/02/15 1537  TROPONINI 0.52*   BNP (last 3 results)  Recent Labs  02/01/15 1709  BNP 223.0*    CBG:  Recent Labs Lab 02/08/15 1012 02/08/15 1102 02/08/15 1630 02/08/15 2122 02/09/15 0757  GLUCAP 181* 195* 143* 177* 187*    Recent Results (from the past 240 hour(s))  Blood Culture (routine x 2)     Status: None   Collection Time: 02/01/15  6:35 PM  Result Value Ref Range Status   Specimen Description  BLOOD LEFT ARM  Final   Special Requests BOTTLES DRAWN AEROBIC AND ANAEROBIC 5ML  Final   Culture NO GROWTH 5 DAYS  Final   Report Status 02/06/2015 FINAL  Final  Blood Culture (routine x 2)     Status: None   Collection Time: 02/01/15  6:40 PM  Result Value Ref Range Status   Specimen Description BLOOD RIGHT HAND  Final   Special Requests BOTTLES DRAWN AEROBIC AND ANAEROBIC 5ML  Final   Culture NO GROWTH 5 DAYS  Final   Report Status 02/06/2015 FINAL  Final  MRSA PCR Screening     Status: None   Collection Time: 02/01/15  9:37 PM  Result Value Ref Range Status   MRSA by PCR NEGATIVE NEGATIVE Final    Comment:        The GeneXpert MRSA  Assay (FDA approved for NASAL specimens only), is one component of a comprehensive MRSA colonization surveillance program. It is not intended to diagnose MRSA infection nor to guide or monitor treatment for MRSA infections.   Urine culture     Status: None   Collection Time: 02/02/15 12:11 AM  Result Value Ref Range Status   Specimen Description URINE, RANDOM  Final   Special Requests Normal  Final   Culture NO GROWTH 2 DAYS  Final   Report Status 02/04/2015 FINAL  Final     Scheduled Meds: . aspirin  325 mg Oral Daily  . heparin  5,000 Units Subcutaneous 3 times per day  . insulin aspart  0-5 Units Subcutaneous QHS  . insulin aspart  0-9 Units Subcutaneous TID WC  . insulin glargine  6 Units Subcutaneous QHS  . polyethylene glycol  17 g Oral Daily  . sodium chloride  3 mL Intravenous Q12H  . tamsulosin  0.4 mg Oral QPC supper    Assessment/Plan:  1. Severe sepsis with hypotension and acute renal failure. Antibiotics completed. Was felt to be urinary tract source. 2. Acute renal failureon CKD stage 3-  likely ATN with sepsis and hypotension. Patient had two dialysis sessions. Creatinine still very impaired. Continue to monitor this portal will be determined based on further nephrology input 3. Type 2 diabetes mellitus without complication. Patient on low dose Lantus and sliding scale at this point. 4. Weakness- physical therapy evaluation rec rehab 5. Morbid obesity weight loss needed 6. Likely BPH- Flomax  Code Status:     Code Status Orders        Start     Ordered   02/01/15 1953  Full code   Continuous     02/01/15 1953    Code Status History    Date Active Date Inactive Code Status Order ID Comments User Context   This patient has a current code status but no historical code status.    Advance Directive Documentation        Most Recent Value   Type of Advance Directive  Healthcare Power of Attorney, Living will   Pre-existing out of facility DNR order  (yellow form or pink MOST form)     "MOST" Form in Place?       Family Communication: Family yesterday Disposition Plan: To be determined depending on his kidney function  Consultants:  Nephrology  Time spent: 20 minutes  Dakota Estes, [facility]

## 2015-02-09 NOTE — Progress Notes (Signed)
[facility]  ROUNDING NOTE   Subjective:   Daughter at bedside.  UOP 1775 Creatinine 6.94 (7.55) BUN 57 (59)  Objective:  Vital signs in last 24 hours:  Temp:  [98 F (36.7 C)-99.5 F (37.5 C)] 98 F (36.7 C) (01/26 1336) Pulse Rate:  [81-96] 81 (01/26 1336) Resp:  [14-20] 18 (01/26 1336) BP: (110-222)/(51-186) 142/71 mmHg (01/26 1336) SpO2:  [92 %-99 %] 98 % (01/26 1336)  Weight change:  Filed Weights   02/05/15 1625 02/08/15 1132 02/08/15 1352  Weight: 204 kg (449 lb 11.8 oz) 192.07 kg (423 lb 7 oz) 191.872 kg (423 lb)    Intake/Output: I/O last 3 completed shifts: In: 3 [I.V.:3] Out: 2075 [Urine:2075]   Intake/Output this shift:  Total I/O In: 360 [P.O.:360] Out: 800 [Urine:800]  Physical Exam: General: Obese, NAD  Head: Normocephalic, atraumatic. Moist oral mucosal membranes  Eyes: Anicteric  Neck: Supple, trachea midline  Lungs:  Clear to auscultation, normal effort  Heart: Regular rate and rhythm  Abdomen:  Soft, mild lower abdominal tenderness, BS present  Extremities: trace peripheral edema.  Neurologic: Nonfocal, moving all four extremities  Skin: No lesions  Access: L IJ temporary dialysis catheter Dr. Delana Meyer 02/04/15, RIJ permcath 02/08/15 Dr. Delana Meyer    Basic Metabolic Panel:  Recent Labs Lab 02/05/15 0543 02/06/15 0528 02/07/15 0625 02/08/15 0645 02/09/15 1130  NA 135 137 137 132* 133*  K 4.0 4.7 4.7 4.0 3.7  CL 103 102 102 97* 98*  CO2 20* 24 23 23 25   GLUCOSE 165* 172* 168* 173* 175*  BUN 69* 60* 61* 59* 57*  CREATININE 7.66* 7.09* 7.58* 7.55* 6.94*  CALCIUM 8.3* 8.4* 8.5* 8.6* 8.0*  PHOS  --   --   --   --  5.2*    Liver Function Tests:  Recent Labs Lab 02/09/15 1130  ALBUMIN 2.5*   No results for input(s): LIPASE, AMYLASE in the last 168 hours. No results for input(s): AMMONIA in the last 168 hours.  CBC:  Recent Labs Lab 02/03/15 0651 02/04/15 0445 02/07/15 0625 02/09/15 1130  WBC 15.9* 14.4* 15.9*  9.9  HGB 11.0* 10.4* 10.6* 9.9*  HCT 33.3* 31.2* 31.8* 29.0*  MCV 87.5 87.2 84.4 84.1  PLT 125* 129* 176 236    Cardiac Enzymes:  Recent Labs Lab 02/02/15 1537  TROPONINI 0.52*    BNP: Invalid input(s): POCBNP  CBG:  Recent Labs Lab 02/08/15 1102 02/08/15 1630 02/08/15 2122 02/09/15 0757 02/09/15 1201  GLUCAP 195* 143* 177* 187* 182*    Microbiology: Results for orders placed or performed during the hospital encounter of 02/01/15  Blood Culture (routine x 2)     Status: None   Collection Time: 02/01/15  6:35 PM  Result Value Ref Range Status   Specimen Description BLOOD LEFT ARM  Final   Special Requests BOTTLES DRAWN AEROBIC AND ANAEROBIC 5ML  Final   Culture NO GROWTH 5 DAYS  Final   Report Status 02/06/2015 FINAL  Final  Blood Culture (routine x 2)     Status: None   Collection Time: 02/01/15  6:40 PM  Result Value Ref Range Status   Specimen Description BLOOD RIGHT HAND  Final   Special Requests BOTTLES DRAWN AEROBIC AND ANAEROBIC 5ML  Final   Culture NO GROWTH 5 DAYS  Final   Report Status 02/06/2015 FINAL  Final  MRSA PCR Screening     Status: None   Collection Time: 02/01/15  9:37 PM  Result Value Ref Range Status  MRSA by PCR NEGATIVE NEGATIVE Final    Comment:        The GeneXpert MRSA Assay (FDA approved for NASAL specimens only), is one component of a comprehensive MRSA colonization surveillance program. It is not intended to diagnose MRSA infection nor to guide or monitor treatment for MRSA infections.   Urine culture     Status: None   Collection Time: 02/02/15 12:11 AM  Result Value Ref Range Status   Specimen Description URINE, RANDOM  Final   Special Requests Normal  Final   Culture NO GROWTH 2 DAYS  Final   Report Status 02/04/2015 FINAL  Final    Coagulation Studies: No results for input(s): LABPROT, INR in the last 72 hours.  Urinalysis: No results for input(s): COLORURINE, LABSPEC, PHURINE, GLUCOSEU, HGBUR, BILIRUBINUR,  KETONESUR, PROTEINUR, UROBILINOGEN, NITRITE, LEUKOCYTESUR in the last 72 hours.  Invalid input(s): APPERANCEUR    Imaging: No results found.   Medications:     . aspirin  325 mg Oral Daily  . heparin  5,000 Units Subcutaneous 3 times per day  . insulin aspart  0-5 Units Subcutaneous QHS  . insulin aspart  0-9 Units Subcutaneous TID WC  . insulin glargine  6 Units Subcutaneous QHS  . polyethylene glycol  17 g Oral Daily  . sodium chloride  3 mL Intravenous Q12H  . tamsulosin  0.4 mg Oral QPC supper   sodium chloride, sodium chloride, acetaminophen **OR** acetaminophen, alteplase, pentafluoroprop-tetrafluoroeth, zolpidem  Assessment/ Plan:  75 y.o. male  with a PMHx of diabetes mellitus, hypertension, rheumatoid arthritis, congenitally atrophic right kidney who was admitted to [facility] on 02/01/2015 for evaluation of shortness of breath and lower abdominal pain.   1. Acute renal failure with proteinuria: no evidence of chronic kidney disease prior to admission.  Patient presented with poor by mouth intake and severe urinary tract infection. Ultrasound was negative for hydronephrosis. He is known to have a congenitally absent/very atrophic right kidney. Baseline creatinine 1.1. Negative SPEP/UPEP/ANCA antibodies/GBM antibodies He has completed 2 treatments of hemodialysis. Last hemodialysis was 02/05/15. Tolerated treatments well. Now with nonoliguric urine output.  Will monitor daily for dialysis need.  Continue hold dialysis and monitor for recovery. Creatinine is improving  2. Urinary tract infection. Pyuria and hematuria noted. TNTC RBCs/WBCs,  Urine culture no growth Continue ceftriaxone   3. Anemia with acute renal failure:  Hemoglobin 9.9. Platelets improved SPEP and UPEP negative. - no epo administered.   4. Hypertension: blood pressure now at goal.  - holding ramipril due to acute renal failure.   5. 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    LOS: [place], [place] 1/26/20172:31 PM

## 2015-02-10 LAB — CBC
HEMATOCRIT: 29.8 % — AB (ref 40.0–52.0)
HEMOGLOBIN: 10 g/dL — AB (ref 13.0–18.0)
MCH: 28.3 pg (ref 26.0–34.0)
MCHC: 33.5 g/dL (ref 32.0–36.0)
MCV: 84.5 fL (ref 80.0–100.0)
Platelets: 255 10*3/uL (ref 150–440)
RBC: 3.52 MIL/uL — AB (ref 4.40–5.90)
RDW: 14.3 % (ref 11.5–14.5)
WBC: 10.9 10*3/uL — ABNORMAL HIGH (ref 3.8–10.6)

## 2015-02-10 LAB — RENAL FUNCTION PANEL
Albumin: 2.6 g/dL — ABNORMAL LOW (ref 3.5–5.0)
Anion gap: 11 (ref 5–15)
BUN: 56 mg/dL — ABNORMAL HIGH (ref 6–20)
CALCIUM: 8.3 mg/dL — AB (ref 8.9–10.3)
CO2: 24 mmol/L (ref 22–32)
CREATININE: 6.39 mg/dL — AB (ref 0.61–1.24)
Chloride: 102 mmol/L (ref 101–111)
GFR, EST AFRICAN AMERICAN: 9 mL/min — AB (ref >60)
GFR, EST NON AFRICAN AMERICAN: 8 mL/min — AB (ref >60)
Glucose, Bld: 168 mg/dL — ABNORMAL HIGH (ref 65–99)
Phosphorus: 5.2 mg/dL — ABNORMAL HIGH (ref 2.5–4.6)
Potassium: 3.7 mmol/L (ref 3.5–5.1)
SODIUM: 137 mmol/L (ref 135–145)

## 2015-02-10 LAB — GLUCOSE, CAPILLARY
GLUCOSE-CAPILLARY: 149 mg/dL — AB (ref 65–99)
GLUCOSE-CAPILLARY: 150 mg/dL — AB (ref 65–99)
GLUCOSE-CAPILLARY: 184 mg/dL — AB (ref 65–99)
Glucose-Capillary: 171 mg/dL — ABNORMAL HIGH (ref 65–99)

## 2015-02-10 NOTE — Care Management Important Message (Signed)
Important Message  Patient Details  Name: Dakota Estes MRN: [id number] Date of Birth: Dec 27, 1940   Medicare Important Message Given:  Yes    [name] A Jeananne Bedwell 02/10/2015, 11:39 AM

## 2015-02-10 NOTE — Progress Notes (Signed)
Physical Therapy Treatment Patient Details Name: Dakota Estes MRN: [id number] DOB: 19-Jun-1940 Today's Date: 02/10/2015    History of Present Illness 75 yo male with onset of  UTI sepsis was admitted and has developed weakness of LE's that is compromising abiltiy to walk.    PT Comments    Patient continues to marked functional endurance deficits; unable to tolerate gait beyond distances of approx 50' due to fatigue.  Requires extended rest periods after all bouts of activity. Per wife, patient scheduled for removal of L IJ dialysis cath next date; has not received dialysis in previous 3 days.  Follow Up Recommendations  SNF     Equipment Recommendations  None recommended by PT    Recommendations for Other Services Rehab consult     Precautions / Restrictions Precautions Precautions: Fall Precaution Comments: L IJ temp dialysis catheter, R chest perm cath (placed 1/25) Restrictions Weight Bearing Restrictions: No    Mobility  Bed Mobility               General bed mobility comments: seated in recliner beginning/end of session  Transfers   Equipment used: Rolling walker (2 wheeled) Transfers: Sit to/from Stand Sit to Stand: Min assist;+2 safety/equipment         General transfer comment: heavy use of rocking/momentum to initiate movement transition; very broad BOS  Ambulation/Gait Ambulation/Gait assistance: Min assist;+2 safety/equipment Ambulation Distance (Feet): 25 Feet Assistive device: Rolling walker (2 wheeled)     Gait velocity interpretation: <1.8 ft/sec, indicative of risk for recurrent falls General Gait Details: broad BOS with forward flexed posture; no buckling or LOb; distance limited by fatigue   Stairs            Wheelchair Mobility    Modified Rankin (Stroke Patients Only)       Balance Overall balance assessment: Needs assistance Sitting-balance support: No upper extremity supported;Feet supported Sitting  balance-Leahy Scale: Good     Standing balance support: Bilateral upper extremity supported Standing balance-Leahy Scale: Fair                      Cognition Arousal/Alertness: Awake/alert Behavior During Therapy: WFL for tasks assessed/performed Overall Cognitive Status: Within Functional Limits for tasks assessed                      Exercises Other Exercises Other Exercises: Additional gait trials 50' x1, 60' x1 with bariatric RW, min assist +2 for safety; deviations as noted above. Other Exercises: Sit/stand from recliner surface (x3) and from bed surface (x1) with min assis t+1--heavy use of momentum/rocking and bilat UEs on RW for all movement transtiions    General Comments        Pertinent Vitals/Pain Pain Assessment: No/denies pain    Home Living                      Prior Function            PT Goals (current goals can now be found in the care plan section) Acute Rehab PT Goals Patient Stated Goal: To walk more independently PT Goal Formulation: With patient Time For Goal Achievement: 02/19/15 Potential to Achieve Goals: Good Progress towards PT goals: Progressing toward goals    Frequency  Min 2X/week    PT Plan Current plan remains appropriate    Co-evaluation             End of Session Equipment Utilized During Treatment: Gait  belt Activity Tolerance: Patient tolerated treatment well Patient left: in chair;with chair alarm set;with family/visitor present     Time: [id number] PT Time Calculation (min) (ACUTE ONLY): 31 min  Charges:  $Gait Training: 8-22 mins $Therapeutic Activity: 8-22 mins                    G Codes:      Adlai Nieblas H. Owens Shark, PT, DPT, NCS 02/10/2015, 4:50 PM [phone]

## 2015-02-10 NOTE — NC FL2 (Signed)
[place] LEVEL OF CARE SCREENING TOOL     IDENTIFICATION  Patient Name: Dakota Estes Birthdate: 05-Sep-1940 Sex: male Admission Date (Current Location): 02/01/2015  [place] and [place] Number:  Engineering geologist and Address:  [facility], [address], [place], [place] [address]      Provider Number: [id number]  Attending Physician Name and Address:  Dustin Flock, MD  Relative Name and Phone Number:       Current Level of Care: Hospital Recommended Level of Care: [place] Prior Approval Number:    Date Approved/Denied:   PASRR Number:  ([id number])  Discharge Plan: SNF    Current Diagnoses: Patient Active Problem List   Diagnosis Date Noted  . Sepsis ([place]) 02/01/2015    Orientation RESPIRATION BLADDER Height & Weight    Self, Time, Situation, Place  O2 (2L) Continent 6\' 5"  (195.6 cm) 449 lbs.  BEHAVIORAL SYMPTOMS/MOOD NEUROLOGICAL BOWEL NUTRITION STATUS      Incontinent Diet (Heart Healthy/Carb Modified, Thin)  AMBULATORY STATUS COMMUNICATION OF NEEDS Skin   Extensive Assist Verbally Normal                       Personal Care Assistance Level of Assistance  Bathing, Feeding, Dressing Bathing Assistance: Maximum assistance Feeding assistance: Maximum assistance Dressing Assistance: Maximum assistance     Functional Limitations Info  Sight, Hearing, Speech Sight Info: Adequate Hearing Info: Adequate Speech Info: Adequate    SPECIAL CARE FACTORS FREQUENCY  PT (By licensed PT)     PT Frequency: 5              Contractures      Additional Factors Info  Code Status, Allergies, Insulin Sliding Scale Code Status Info: Full Code Allergies Info: No known allergies   Insulin Sliding Scale Info: 4 times a day       Current Medications (02/10/2015):  This is the current hospital active medication list Current Facility-Administered Medications  Medication Dose Route Frequency  Provider Last Rate Last Dose  . 0.9 %  sodium chloride infusion  100 mL Intravenous PRN Munsoor Lateef, MD      . 0.9 %  sodium chloride infusion  100 mL Intravenous PRN Munsoor Lateef, MD      . acetaminophen (TYLENOL) tablet 650 mg  650 mg Oral Q6H PRN Loletha Grayer, MD       Or  . acetaminophen (TYLENOL) suppository 650 mg  650 mg Rectal Q6H PRN Loletha Grayer, MD      . alteplase (CATHFLO ACTIVASE) injection 2 mg  2 mg Intracatheter Once PRN Munsoor Lateef, MD      . aspirin tablet 325 mg  325 mg Oral Daily Loletha Grayer, MD   325 mg at 02/10/15 1006  . heparin injection 5,000 Units  5,000 Units Subcutaneous 3 times per day Loletha Grayer, MD   5,000 Units at 02/10/15 0547  . insulin aspart (novoLOG) injection 0-5 Units  0-5 Units Subcutaneous QHS Loletha Grayer, MD   0 Units at 02/02/15 0018  . insulin aspart (novoLOG) injection 0-9 Units  0-9 Units Subcutaneous TID WC Loletha Grayer, MD   1 Units at 02/10/15 1006  . insulin glargine (LANTUS) injection 6 Units  6 Units Subcutaneous QHS Loletha Grayer, MD   6 Units at 02/09/15 2152  . pentafluoroprop-tetrafluoroeth (GEBAUERS) aerosol 1 application  1 application Topical PRN Munsoor Lateef, MD      . polyethylene glycol (MIRALAX / GLYCOLAX) packet 17  g  17 g Oral Daily Loletha Grayer, MD   17 g at 02/07/15 [phone]  . sodium chloride 0.9 % injection 3 mL  3 mL Intravenous Q12H Loletha Grayer, MD   3 mL at 02/10/15 1007  . tamsulosin (FLOMAX) capsule 0.4 mg  0.4 mg Oral QPC supper Loletha Grayer, MD   0.4 mg at 02/09/15 1701  . zolpidem (AMBIEN) tablet 5 mg  5 mg Oral QHS PRN Katha Cabal, MD   5 mg at 02/06/15 2147     Discharge Medications: Please see discharge summary for a list of discharge medications.  Relevant Imaging Results:  Relevant Lab Results:   Additional Information SSN:  [id number]  Maurine Cane, LCSW

## 2015-02-10 NOTE — Clinical Social Work Note (Signed)
Clinical Social Work Assessment  Patient Details  Name: Dakota Estes MRN: [id number] Date of Birth: September 11, 1940  Date of referral:  02/10/15               Reason for consult:  Facility Placement                Permission sought to share information with:  Facility Art therapist granted to share information::     Name::        Agency::     Relationship::     Contact Information:     Housing/Transportation Living arrangements for the past 2 months:  Single Family Home Source of Information:  Patient, Medical Team Patient Interpreter Needed:  None Criminal Activity/Legal Involvement Pertinent to Current Situation/Hospitalization:    Significant Relationships:  Adult Children, Significant Other, Friend Lives with:  Adult Children Do you feel safe going back to the place where you live?  Yes Need for family participation in patient care:  No (Coment)  Care giving concerns:  None at this time.   Social Worker assessment / plan:  Holiday representative (CSW) consult for SNF placement, PT is recommending STR to assist with getting patient back to previous level of functioning.   Patient was alert, oriented and engaged in conversation with CSW.  CSW introduced self and explained role of CSW department. Patient male friend of 22 years was at bedside.  Patient ok for CSW to talk when she is there.   Patient states his daughter lives with him. States he has never been sick and was independent of his ADL's  prior to coming to the hospital .   CSW explained that PT is recommending rehab.  Patient has never been to SNF. CSW reviewed SNF process with patient, Patient is agreeable to SNF search and prefers [facility] [facility]     CSW will complete FL2, and fax out for available bed in anticipation of discharging to SNF for rehab.   Employment status:  Retired Nurse, adult PT Recommendations:  [place] / Referral to  community resources:  [place]  Patient/Family's Response to care:  Patient was appreciative of information provided by CSW will review list and select 2nd choice in the event [facility] is unable to offer.    Patient/Family's Understanding of and Emotional Response to Diagnosis, Current Treatment, and Prognosis:  Patient understands that he is under continued medical work up at this time.  Once medically stable he will discharge to SNF for STR.  Emotional Assessment Appearance:  Appears stated age Attitude/Demeanor/Rapport:    Affect (typically observed):  Accepting, Adaptable, Appropriate, Calm, Pleasant Orientation:  Oriented to Self, Oriented to Place, Oriented to  Time, Oriented to Situation Alcohol / Substance use:    Psych involvement (Current and /or in the community):  No (Comment)  Discharge Needs  Concerns to be addressed:  Care Coordination, Discharge Planning Concerns Readmission within the last 30 days:  No Current discharge risk:  Chronically ill, Dependent with Mobility Barriers to Discharge:  Continued Medical Work up   Maurine Cane, LCSW 02/10/2015, 12:32 PM

## 2015-02-10 NOTE — Progress Notes (Addendum)
CSW presented SNF bed offers to patient.  Patient has selected [facility].   CSW received insurance auth number [id number] from [facility].   CSW will continue to follow patient and assist with ongoing and discharge needs when patient is medically stable to discharge.  Dakota Estes. [place] Work Department [phone] 3:13 PM

## 2015-02-10 NOTE — Progress Notes (Signed)
[facility]  ROUNDING NOTE   Subjective:   Significant other at bedside.  UOP 1550 (1775_ Creatinine 6.39 (6.94) (7.55) BUN 57 (59)  Objective:  Vital signs in last 24 hours:  Temp:  [97.9 F (36.6 C)-99.4 F (37.4 C)] 98.5 F (36.9 C) (01/27 0906) Pulse Rate:  [81-92] 91 (01/27 0906) Resp:  [16-22] 16 (01/27 0906) BP: (117-142)/(53-71) 119/60 mmHg (01/27 0906) SpO2:  [97 %-100 %] 97 % (01/27 0906)  Weight change:  Filed Weights   02/05/15 1625 02/08/15 1132 02/08/15 1352  Weight: 204 kg (449 lb 11.8 oz) 192.07 kg (423 lb 7 oz) 191.872 kg (423 lb)    Intake/Output: I/O last 3 completed shifts: In: 480 [P.O.:480] Out: 2625 [Urine:2625]   Intake/Output this shift:     Physical Exam: General: Obese, NAD  Head: Normocephalic, atraumatic. Moist oral mucosal membranes  Eyes: Anicteric  Neck: Supple, trachea midline  Lungs:  Clear to auscultation, normal effort  Heart: Regular rate and rhythm  Abdomen:  Soft, mild lower abdominal tenderness, BS present  Extremities: trace peripheral edema.  Neurologic: Nonfocal, moving all four extremities  Skin: No lesions  Access: L IJ temporary dialysis catheter Dr. Delana Meyer 02/04/15, RIJ permcath 02/08/15 Dr. Delana Meyer    Basic Metabolic Panel:  Recent Labs Lab 02/06/15 0528 02/07/15 0625 02/08/15 0645 02/09/15 1130 02/10/15 0140  NA 137 137 132* 133* 137  K 4.7 4.7 4.0 3.7 3.7  CL 102 102 97* 98* 102  CO2 24 23 23 25 24   GLUCOSE 172* 168* 173* 175* 168*  BUN 60* 61* 59* 57* 56*  CREATININE 7.09* 7.58* 7.55* 6.94* 6.39*  CALCIUM 8.4* 8.5* 8.6* 8.0* 8.3*  PHOS  --   --   --  5.2* 5.2*    Liver Function Tests:  Recent Labs Lab 02/09/15 1130 02/10/15 0140  ALBUMIN 2.5* 2.6*   No results for input(s): LIPASE, AMYLASE in the last 168 hours. No results for input(s): AMMONIA in the last 168 hours.  CBC:  Recent Labs Lab 02/04/15 0445 02/07/15 0625 02/09/15 1130 02/10/15 0140  WBC 14.4* 15.9* 9.9  10.9*  HGB 10.4* 10.6* 9.9* 10.0*  HCT 31.2* 31.8* 29.0* 29.8*  MCV 87.2 84.4 84.1 84.5  PLT 129* 176 236 255    Cardiac Enzymes: No results for input(s): CKTOTAL, CKMB, CKMBINDEX, TROPONINI in the last 168 hours.  BNP: Invalid input(s): POCBNP  CBG:  Recent Labs Lab 02/09/15 0757 02/09/15 1201 02/09/15 1633 02/09/15 2124 02/10/15 0723  GLUCAP 187* 182* 208* 182* 149*    Microbiology: Results for orders placed or performed during the hospital encounter of 02/01/15  Blood Culture (routine x 2)     Status: None   Collection Time: 02/01/15  6:35 PM  Result Value Ref Range Status   Specimen Description BLOOD LEFT ARM  Final   Special Requests BOTTLES DRAWN AEROBIC AND ANAEROBIC 5ML  Final   Culture NO GROWTH 5 DAYS  Final   Report Status 02/06/2015 FINAL  Final  Blood Culture (routine x 2)     Status: None   Collection Time: 02/01/15  6:40 PM  Result Value Ref Range Status   Specimen Description BLOOD RIGHT HAND  Final   Special Requests BOTTLES DRAWN AEROBIC AND ANAEROBIC 5ML  Final   Culture NO GROWTH 5 DAYS  Final   Report Status 02/06/2015 FINAL  Final  MRSA PCR Screening     Status: None   Collection Time: 02/01/15  9:37 PM  Result Value Ref Range Status  MRSA by PCR NEGATIVE NEGATIVE Final    Comment:        The GeneXpert MRSA Assay (FDA approved for NASAL specimens only), is one component of a comprehensive MRSA colonization surveillance program. It is not intended to diagnose MRSA infection nor to guide or monitor treatment for MRSA infections.   Urine culture     Status: None   Collection Time: 02/02/15 12:11 AM  Result Value Ref Range Status   Specimen Description URINE, RANDOM  Final   Special Requests Normal  Final   Culture NO GROWTH 2 DAYS  Final   Report Status 02/04/2015 FINAL  Final    Coagulation Studies: No results for input(s): LABPROT, INR in the last 72 hours.  Urinalysis: No results for input(s): COLORURINE, LABSPEC, PHURINE,  GLUCOSEU, HGBUR, BILIRUBINUR, KETONESUR, PROTEINUR, UROBILINOGEN, NITRITE, LEUKOCYTESUR in the last 72 hours.  Invalid input(s): APPERANCEUR    Imaging: No results found.   Medications:     . aspirin  325 mg Oral Daily  . heparin  5,000 Units Subcutaneous 3 times per day  . insulin aspart  0-5 Units Subcutaneous QHS  . insulin aspart  0-9 Units Subcutaneous TID WC  . insulin glargine  6 Units Subcutaneous QHS  . polyethylene glycol  17 g Oral Daily  . sodium chloride  3 mL Intravenous Q12H  . tamsulosin  0.4 mg Oral QPC supper   sodium chloride, sodium chloride, acetaminophen **OR** acetaminophen, alteplase, pentafluoroprop-tetrafluoroeth, zolpidem  Assessment/ Plan:  75 y.o. male  with a PMHx of diabetes mellitus, hypertension, rheumatoid arthritis, congenitally atrophic right kidney who was admitted to [facility] on 02/01/2015 for evaluation of shortness of breath and lower abdominal pain.   1. Acute renal failure with proteinuria: no evidence of chronic kidney disease prior to admission.  Patient presented with poor by mouth intake and severe urinary tract infection. Ultrasound was negative for hydronephrosis. He is known to have a congenitally absent/very atrophic right kidney. Baseline creatinine 1.1. Negative SPEP/UPEP/ANCA antibodies/GBM antibodies He has completed 2 treatments of hemodialysis. Last hemodialysis was 02/05/15. Tolerated treatments well. Now with nonoliguric urine output.  Will monitor daily for dialysis need.  Continue hold dialysis and monitor for recovery. Creatinine is improving  2. Urinary tract infection. Pyuria and hematuria noted. TNTC RBCs/WBCs,  Urine culture no growth Continue ceftriaxone   3. Anemia with acute renal failure:  Hemoglobin 10. Platelets improved SPEP and UPEP negative. - no epo administered.   4. Hypertension: blood pressure now at goal.  - holding ramipril due to acute renal failure.   5. 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    LOS: 9 Janine Reller 1/27/201710:15 AM

## 2015-02-10 NOTE — Progress Notes (Signed)
Patient ID: Dakota Estes, male   DOB: 02-03-1940, 75 y.o.   MRN: [id number] [name] DOB: 08-30-1940 DOA: 02/01/2015 PCP: No primary care provider on file.  HPI/Subjective: Patient renal function continues to improve is very weak and was unable to work with PT yesterday    Objective: Filed Vitals:   02/10/15 0535 02/10/15 0906  BP: 136/70 119/60  Pulse: 92 91  Temp: 97.9 F (36.6 C) 98.5 F (36.9 C)  Resp:  16    Filed Weights   02/05/15 1625 02/08/15 1132 02/08/15 1352  Weight: 204 kg (449 lb 11.8 oz) 192.07 kg (423 lb 7 oz) 191.872 kg (423 lb)    ROS: Review of Systems  Constitutional: Negative for fever and chills.  Eyes: Negative for blurred vision.  Respiratory: Negative for cough and shortness of breath.   Cardiovascular: Negative for chest pain.  Gastrointestinal: Negative for nausea, vomiting, abdominal pain, diarrhea and constipation.  Genitourinary: Negative for dysuria.  Musculoskeletal: Negative for joint pain.  Neurological: Negative for dizziness and headaches.   Exam: Physical Exam  Constitutional: He is oriented to person, place, and time.  HENT:  Nose: No mucosal edema.  Mouth/Throat: No oropharyngeal exudate or posterior oropharyngeal edema.  Eyes: Conjunctivae, EOM and lids are normal. Pupils are equal, round, and reactive to light.  Neck: No JVD present. Carotid bruit is not present. No edema present. No thyroid mass and no thyromegaly present.  Cardiovascular: S1 normal and S2 normal.  Exam reveals no gallop.   No murmur heard. Pulses:      Dorsalis pedis pulses are 2+ on the right side, and 2+ on the left side.  Respiratory: No respiratory distress. He has no wheezes. He has no rhonchi. He has no rales.  GI: Soft. Bowel sounds are normal. There is no tenderness.  Musculoskeletal:       Right ankle: He exhibits swelling.       Left ankle: He exhibits swelling.   Lymphadenopathy:    He has no cervical adenopathy.  Neurological: He is alert and oriented to person, place, and time. No cranial nerve deficit.  Skin: Skin is warm. Nails show no clubbing.  Chronic lower extremity discoloration  Psychiatric: He has a normal mood and affect.    Data Reviewed: Basic Metabolic Panel:  Recent Labs Lab 02/06/15 0528 02/07/15 0625 02/08/15 0645 02/09/15 1130 02/10/15 0140  NA 137 137 132* 133* 137  K 4.7 4.7 4.0 3.7 3.7  CL 102 102 97* 98* 102  CO2 24 23 23 25 24   GLUCOSE 172* 168* 173* 175* 168*  BUN 60* 61* 59* 57* 56*  CREATININE 7.09* 7.58* 7.55* 6.94* 6.39*  CALCIUM 8.4* 8.5* 8.6* 8.0* 8.3*  PHOS  --   --   --  5.2* 5.2*   CBC:  Recent Labs Lab 02/04/15 0445 02/07/15 0625 02/09/15 1130 02/10/15 0140  WBC 14.4* 15.9* 9.9 10.9*  HGB 10.4* 10.6* 9.9* 10.0*  HCT 31.2* 31.8* 29.0* 29.8*  MCV 87.2 84.4 84.1 84.5  PLT 129* 176 236 255   Cardiac Enzymes: No results for input(s): CKTOTAL, CKMB, CKMBINDEX, TROPONINI in the last 168 hours. BNP (last 3 results)  Recent Labs  02/01/15 1709  BNP 223.0*    CBG:  Recent Labs Lab 02/09/15 0757 02/09/15 1201 02/09/15 1633 02/09/15 2124 02/10/15 0723  GLUCAP 187* 182* 208* 182* 149*    Recent Results (from the past 240 hour(s))  Blood Culture (routine x 2)  Status: None   Collection Time: 02/01/15  6:35 PM  Result Value Ref Range Status   Specimen Description BLOOD LEFT ARM  Final   Special Requests BOTTLES DRAWN AEROBIC AND ANAEROBIC 5ML  Final   Culture NO GROWTH 5 DAYS  Final   Report Status 02/06/2015 FINAL  Final  Blood Culture (routine x 2)     Status: None   Collection Time: 02/01/15  6:40 PM  Result Value Ref Range Status   Specimen Description BLOOD RIGHT HAND  Final   Special Requests BOTTLES DRAWN AEROBIC AND ANAEROBIC 5ML  Final   Culture NO GROWTH 5 DAYS  Final   Report Status 02/06/2015 FINAL  Final  MRSA PCR Screening     Status: None   Collection Time:  02/01/15  9:37 PM  Result Value Ref Range Status   MRSA by PCR NEGATIVE NEGATIVE Final    Comment:        The GeneXpert MRSA Assay (FDA approved for NASAL specimens only), is one component of a comprehensive MRSA colonization surveillance program. It is not intended to diagnose MRSA infection nor to guide or monitor treatment for MRSA infections.   Urine culture     Status: None   Collection Time: 02/02/15 12:11 AM  Result Value Ref Range Status   Specimen Description URINE, RANDOM  Final   Special Requests Normal  Final   Culture NO GROWTH 2 DAYS  Final   Report Status 02/04/2015 FINAL  Final     Scheduled Meds: . aspirin  325 mg Oral Daily  . heparin  5,000 Units Subcutaneous 3 times per day  . insulin aspart  0-5 Units Subcutaneous QHS  . insulin aspart  0-9 Units Subcutaneous TID WC  . insulin glargine  6 Units Subcutaneous QHS  . polyethylene glycol  17 g Oral Daily  . sodium chloride  3 mL Intravenous Q12H  . tamsulosin  0.4 mg Oral QPC supper    Assessment/Plan:  1. Severe sepsis with hypotension and acute renal failure. Antibiotics completed. Was felt to be urinary tract source. 2. Acute renal failureon CKD stage 3-  likely ATN with sepsis and hypotension. Patient had two dialysis sessions. Renal function trending in the right direction. 3. Type 2 diabetes mellitus without complication. Patient on low dose Lantus and sliding scale at this point. 4. Weakness- physical therapy evaluation rec rehab 5. Morbid obesity weight loss needed 6. Likely BPH- Flomax   Disposal reevaluation physical therapy Code Status:     Code Status Orders        Start     Ordered   02/01/15 1953  Full code   Continuous     02/01/15 1953    Code Status History    Date Active Date Inactive Code Status Order ID Comments User Context   This patient has a current code status but no historical code status.    Advance Directive Documentation        Most Recent Value   Type of  Advance Directive  Healthcare Power of Attorney, Living will   Pre-existing out of facility DNR order (yellow form or pink MOST form)     "MOST" Form in Place?       Family Communication: Family yesterday Disposition Plan: To be determined depending on his kidney function  Consultants:  Nephrology  Time spent: 20 minutes  [place], [place] [facility]            Patient ID: Dakota Estes, male  DOB: [birth year], 75 y.o.   MRN: [id number] [facility] PROGRESS NOTE  Dakota Estes [id number] DOB: Aug 18, 1940 DOA: 02/01/2015 PCP: No primary care provider on file.  HPI/Subjective: Reports that he is urinating a lot denies any chest pain or shortness of breath    Objective: Filed Vitals:   02/10/15 0535 02/10/15 0906  BP: 136/70 119/60  Pulse: 92 91  Temp: 97.9 F (36.6 C) 98.5 F (36.9 C)  Resp:  16    Filed Weights   02/05/15 1625 02/08/15 1132 02/08/15 1352  Weight: 204 kg (449 lb 11.8 oz) 192.07 kg (423 lb 7 oz) 191.872 kg (423 lb)    ROS: Review of Systems  Constitutional: Negative for fever and chills.  Eyes: Negative for blurred vision.  Respiratory: Negative for cough and shortness of breath.   Cardiovascular: Negative for chest pain.  Gastrointestinal: Negative for nausea, vomiting, abdominal pain, diarrhea and constipation.  Genitourinary: Negative for dysuria.  Musculoskeletal: Negative for joint pain.  Neurological: Negative for dizziness and headaches.   Exam: Physical Exam  Constitutional: He is oriented to person, place, and time.  HENT:  Nose: No mucosal edema.  Mouth/Throat: No oropharyngeal exudate or posterior oropharyngeal edema.  Eyes: Conjunctivae, EOM and lids are normal. Pupils are equal, round, and reactive to light.  Neck: No JVD present. Carotid bruit is not present. No edema present. No thyroid mass and no thyromegaly present.  Cardiovascular: S1 normal and S2 normal.  Exam reveals no  gallop.   No murmur heard. Pulses:      Dorsalis pedis pulses are 2+ on the right side, and 2+ on the left side.  Respiratory: No respiratory distress. He has no wheezes. He has no rhonchi. He has no rales.  GI: Soft. Bowel sounds are normal. There is no tenderness.  Musculoskeletal:       Right ankle: He exhibits swelling.       Left ankle: He exhibits swelling.  Lymphadenopathy:    He has no cervical adenopathy.  Neurological: He is alert and oriented to person, place, and time. No cranial nerve deficit.  Skin: Skin is warm. Nails show no clubbing.  Chronic lower extremity discoloration  Psychiatric: He has a normal mood and affect.    Data Reviewed: Basic Metabolic Panel:  Recent Labs Lab 02/06/15 0528 02/07/15 0625 02/08/15 0645 02/09/15 1130 02/10/15 0140  NA 137 137 132* 133* 137  K 4.7 4.7 4.0 3.7 3.7  CL 102 102 97* 98* 102  CO2 24 23 23 25 24   GLUCOSE 172* 168* 173* 175* 168*  BUN 60* 61* 59* 57* 56*  CREATININE 7.09* 7.58* 7.55* 6.94* 6.39*  CALCIUM 8.4* 8.5* 8.6* 8.0* 8.3*  PHOS  --   --   --  5.2* 5.2*   CBC:  Recent Labs Lab 02/04/15 0445 02/07/15 0625 02/09/15 1130 02/10/15 0140  WBC 14.4* 15.9* 9.9 10.9*  HGB 10.4* 10.6* 9.9* 10.0*  HCT 31.2* 31.8* 29.0* 29.8*  MCV 87.2 84.4 84.1 84.5  PLT 129* 176 236 255   Cardiac Enzymes: No results for input(s): CKTOTAL, CKMB, CKMBINDEX, TROPONINI in the last 168 hours. BNP (last 3 results)  Recent Labs  02/01/15 1709  BNP 223.0*    CBG:  Recent Labs Lab 02/09/15 1201 02/09/15 1633 02/09/15 2124 02/10/15 0723 02/10/15 1207  GLUCAP 182* 208* 182* 149* 150*    Recent Results (from the past 240 hour(s))  Blood Culture (routine x 2)     Status: None   Collection Time:  02/01/15  6:35 PM  Result Value Ref Range Status   Specimen Description BLOOD LEFT ARM  Final   Special Requests BOTTLES DRAWN AEROBIC AND ANAEROBIC 5ML  Final   Culture NO GROWTH 5 DAYS  Final   Report Status 02/06/2015  FINAL  Final  Blood Culture (routine x 2)     Status: None   Collection Time: 02/01/15  6:40 PM  Result Value Ref Range Status   Specimen Description BLOOD RIGHT HAND  Final   Special Requests BOTTLES DRAWN AEROBIC AND ANAEROBIC 5ML  Final   Culture NO GROWTH 5 DAYS  Final   Report Status 02/06/2015 FINAL  Final  MRSA PCR Screening     Status: None   Collection Time: 02/01/15  9:37 PM  Result Value Ref Range Status   MRSA by PCR NEGATIVE NEGATIVE Final    Comment:        The GeneXpert MRSA Assay (FDA approved for NASAL specimens only), is one component of a comprehensive MRSA colonization surveillance program. It is not intended to diagnose MRSA infection nor to guide or monitor treatment for MRSA infections.   Urine culture     Status: None   Collection Time: 02/02/15 12:11 AM  Result Value Ref Range Status   Specimen Description URINE, RANDOM  Final   Special Requests Normal  Final   Culture NO GROWTH 2 DAYS  Final   Report Status 02/04/2015 FINAL  Final     Scheduled Meds: . aspirin  325 mg Oral Daily  . heparin  5,000 Units Subcutaneous 3 times per day  . insulin aspart  0-5 Units Subcutaneous QHS  . insulin aspart  0-9 Units Subcutaneous TID WC  . insulin glargine  6 Units Subcutaneous QHS  . polyethylene glycol  17 g Oral Daily  . sodium chloride  3 mL Intravenous Q12H  . tamsulosin  0.4 mg Oral QPC supper    Assessment/Plan:  7. Severe sepsis with hypotension and acute renal failure. Antibiotics completed. Was felt to be urinary tract source. 8. Acute renal failureon CKD stage 3-  likely ATN with sepsis and hypotension. Patient had two dialysis sessions. Creatinine still very impaired. Continue to monitor this portal will be determined based on further nephrology input 9. Type 2 diabetes mellitus without complication. Patient on low dose Lantus and sliding scale at this point. 10. Weakness- physical therapy evaluation rec rehab 11. Morbid obesity weight loss  needed 12. Likely BPH- Flomax  Code Status:     Code Status Orders        Start     Ordered   02/01/15 1953  Full code   Continuous     02/01/15 1953    Code Status History    Date Active Date Inactive Code Status Order ID Comments User Context   This patient has a current code status but no historical code status.    Advance Directive Documentation        Most Recent Value   Type of Advance Directive  Healthcare Power of Attorney, Living will   Pre-existing out of facility DNR order (yellow form or pink MOST form)     "MOST" Form in Place?       Family Communication: Family yesterday Disposition Plan: To be determined depending on his kidney function  Consultants:  Nephrology  Time spent: 20 minutes  [place], [place] [facility]

## 2015-02-11 LAB — GLUCOSE, CAPILLARY
GLUCOSE-CAPILLARY: 158 mg/dL — AB (ref 65–99)
GLUCOSE-CAPILLARY: 206 mg/dL — AB (ref 65–99)
Glucose-Capillary: 195 mg/dL — ABNORMAL HIGH (ref 65–99)
Glucose-Capillary: 182 mg/dL — ABNORMAL HIGH (ref 65–99)

## 2015-02-11 LAB — RENAL FUNCTION PANEL
Albumin: 2.6 g/dL — ABNORMAL LOW (ref 3.5–5.0)
Anion gap: 11 (ref 5–15)
BUN: 52 mg/dL — AB (ref 6–20)
CALCIUM: 8.3 mg/dL — AB (ref 8.9–10.3)
CO2: 24 mmol/L (ref 22–32)
CREATININE: 5.83 mg/dL — AB (ref 0.61–1.24)
Chloride: 103 mmol/L (ref 101–111)
GFR, EST AFRICAN AMERICAN: 10 mL/min — AB (ref >60)
GFR, EST NON AFRICAN AMERICAN: 8 mL/min — AB (ref >60)
Glucose, Bld: 163 mg/dL — ABNORMAL HIGH (ref 65–99)
Phosphorus: 4.9 mg/dL — ABNORMAL HIGH (ref 2.5–4.6)
Potassium: 3.8 mmol/L (ref 3.5–5.1)
SODIUM: 138 mmol/L (ref 135–145)

## 2015-02-11 NOTE — Progress Notes (Signed)
CSW met with patient and daughter Dakota Estes [phone]) at bedside.  They are requesting a different facility.  CSW explained that patient only received one bed offer.  Daughter and patient would like CSW to complete a bed search for facilities in [place].    CSW will fax out to [facility] for available beds.  Casimer Lanius. [place] Work Department [phone] 3:09 PM

## 2015-02-11 NOTE — Progress Notes (Signed)
Family is eager to have pt Left IJ central line removed. MD instructed staff to attempt a peripheral IV. After two unsuccessful attempts MD was notified and instructed nursing staff to leave Central line in for now. Will continue to monitor.

## 2015-02-11 NOTE — Progress Notes (Signed)
[facility]  ROUNDING NOTE   Subjective:   Family at bedside.  Creatinine 5.83 (6.39)  Objective:  Vital signs in last 24 hours:  Temp:  [98.2 F (36.8 C)-99 F (37.2 C)] 99 F (37.2 C) (01/28 0842) Pulse Rate:  [83-87] 87 (01/28 0842) Resp:  [18-22] 18 (01/28 0842) BP: (123-154)/(50-87) 139/78 mmHg (01/28 0842) SpO2:  [92 %-99 %] 98 % (01/28 0842) Weight:  [194.593 kg (429 lb)] 194.593 kg (429 lb) (01/28 1100)  Weight change:  Filed Weights   02/08/15 1132 02/08/15 1352 02/11/15 1100  Weight: 192.07 kg (423 lb 7 oz) 191.872 kg (423 lb) 194.593 kg (429 lb)    Intake/Output: I/O last 3 completed shifts: In: 120 [P.O.:120] Out: 1970 [Urine:1970]   Intake/Output this shift:  Total I/O In: 240 [P.O.:240] Out: 250 [Urine:250]  Physical Exam: General: Obese, NAD  Head: Normocephalic, atraumatic. Moist oral mucosal membranes  Eyes: Anicteric  Neck: Supple, trachea midline  Lungs:  Clear to auscultation, normal effort  Heart: Regular rate and rhythm  Abdomen:  Soft, mild lower abdominal tenderness, BS present  Extremities: trace peripheral edema.  Neurologic: Nonfocal, moving all four extremities  Skin: No lesions  Access: L IJ temporary dialysis catheter Dr. Delana Meyer 02/04/15, RIJ permcath 02/08/15 Dr. Delana Meyer    Basic Metabolic Panel:  Recent Labs Lab 02/07/15 [id number] 02/08/15 0645 02/09/15 1130 02/10/15 0140 02/11/15 0512  NA 137 132* 133* 137 138  K 4.7 4.0 3.7 3.7 3.8  CL 102 97* 98* 102 103  CO2 23 23 25 24 24   GLUCOSE 168* 173* 175* 168* 163*  BUN 61* 59* 57* 56* 52*  CREATININE 7.58* 7.55* 6.94* 6.39* 5.83*  CALCIUM 8.5* 8.6* 8.0* 8.3* 8.3*  PHOS  --   --  5.2* 5.2* 4.9*    Liver Function Tests:  Recent Labs Lab 02/09/15 1130 02/10/15 0140 02/11/15 0512  ALBUMIN 2.5* 2.6* 2.6*   No results for input(s): LIPASE, AMYLASE in the last 168 hours. No results for input(s): AMMONIA in the last 168 hours.  CBC:  Recent Labs Lab  02/07/15 0625 02/09/15 1130 02/10/15 0140  WBC 15.9* 9.9 10.9*  HGB 10.6* 9.9* 10.0*  HCT 31.8* 29.0* 29.8*  MCV 84.4 84.1 84.5  PLT 176 236 255    Cardiac Enzymes: No results for input(s): CKTOTAL, CKMB, CKMBINDEX, TROPONINI in the last 168 hours.  BNP: Invalid input(s): POCBNP  CBG:  Recent Labs Lab 02/10/15 1207 02/10/15 1636 02/10/15 2113 02/11/15 0743 02/11/15 1140  GLUCAP 150* 184* 171* 158* 206*    Microbiology: Results for orders placed or performed during the hospital encounter of 02/01/15  Blood Culture (routine x 2)     Status: None   Collection Time: 02/01/15  6:35 PM  Result Value Ref Range Status   Specimen Description BLOOD LEFT ARM  Final   Special Requests BOTTLES DRAWN AEROBIC AND ANAEROBIC 5ML  Final   Culture NO GROWTH 5 DAYS  Final   Report Status 02/06/2015 FINAL  Final  Blood Culture (routine x 2)     Status: None   Collection Time: 02/01/15  6:40 PM  Result Value Ref Range Status   Specimen Description BLOOD RIGHT HAND  Final   Special Requests BOTTLES DRAWN AEROBIC AND ANAEROBIC 5ML  Final   Culture NO GROWTH 5 DAYS  Final   Report Status 02/06/2015 FINAL  Final  MRSA PCR Screening     Status: None   Collection Time: 02/01/15  9:37 PM  Result Value Ref  Range Status   MRSA by PCR NEGATIVE NEGATIVE Final    Comment:        The GeneXpert MRSA Assay (FDA approved for NASAL specimens only), is one component of a comprehensive MRSA colonization surveillance program. It is not intended to diagnose MRSA infection nor to guide or monitor treatment for MRSA infections.   Urine culture     Status: None   Collection Time: 02/02/15 12:11 AM  Result Value Ref Range Status   Specimen Description URINE, RANDOM  Final   Special Requests Normal  Final   Culture NO GROWTH 2 DAYS  Final   Report Status 02/04/2015 FINAL  Final    Coagulation Studies: No results for input(s): LABPROT, INR in the last 72 hours.  Urinalysis: No results for  input(s): COLORURINE, LABSPEC, PHURINE, GLUCOSEU, HGBUR, BILIRUBINUR, KETONESUR, PROTEINUR, UROBILINOGEN, NITRITE, LEUKOCYTESUR in the last 72 hours.  Invalid input(s): APPERANCEUR    Imaging: No results found.   Medications:     . aspirin  325 mg Oral Daily  . heparin  5,000 Units Subcutaneous 3 times per day  . insulin aspart  0-5 Units Subcutaneous QHS  . insulin aspart  0-9 Units Subcutaneous TID WC  . insulin glargine  6 Units Subcutaneous QHS  . polyethylene glycol  17 g Oral Daily  . sodium chloride  3 mL Intravenous Q12H  . tamsulosin  0.4 mg Oral QPC supper   sodium chloride, sodium chloride, acetaminophen **OR** acetaminophen, alteplase, pentafluoroprop-tetrafluoroeth, zolpidem  Assessment/ Plan:  75 y.o. male  with a PMHx of diabetes mellitus, hypertension, rheumatoid arthritis, congenitally atrophic right kidney who was admitted to [facility] on 02/01/2015 for evaluation of shortness of breath and lower abdominal pain.   1. Acute renal failure with proteinuria:  Baseline creatinine 1.1. Acute renal failure secondary to ATN. Ultrasound was negative for hydronephrosis. He is known to have a congenitally absent/very atrophic right kidney.  He has completed 2 treatments of hemodialysis. Last hemodialysis was 02/05/15. Tolerated treatments well. Now with nonoliguric urine output.  Will monitor daily for dialysis need.  Continue hold dialysis and monitor for recovery. Creatinine is improving - Currently with permcath. Remove temp HD catheter in [facility] if possible. Being kept for IV access  2. Urinary tract infection. Pyuria and hematuria noted. TNTC RBCs/WBCs,  Urine culture no growth Completed course of ceftriaxone   3. Anemia with acute renal failure:  Hemoglobin 10. Platelets improved SPEP and UPEP negative. - no epo administered.   4. Hypertension: blood pressure now at goal.  - holding ramipril due to acute renal failure.   5. 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    LOS: [place], [place] 1/28/201712:12 PM

## 2015-02-11 NOTE — Progress Notes (Signed)
Patient ID: Dakota Estes, male   DOB: 09-25-1940, 75 y.o.   MRN: [id number] [facility] PROGRESS NOTE  DOUGAL PAOLILLO [id number] DOB: 01-Nov-1940 DOA: 02/01/2015 PCP: No primary care provider on file.  HPI/Subjective: Patient doing better renal function improved. Denies any chest pain or shortness of breath    Objective: Filed Vitals:   02/11/15 0428 02/11/15 0842  BP: 123/50 139/78  Pulse: 83 87  Temp: 98.6 F (37 C) 99 F (37.2 C)  Resp: 20 18    Filed Weights   02/05/15 1625 02/08/15 1132 02/08/15 1352  Weight: 204 kg (449 lb 11.8 oz) 192.07 kg (423 lb 7 oz) 191.872 kg (423 lb)    ROS: Review of Systems  Constitutional: Negative for fever and chills.  Eyes: Negative for blurred vision.  Respiratory: Negative for cough and shortness of breath.   Cardiovascular: Negative for chest pain.  Gastrointestinal: Negative for nausea, vomiting, abdominal pain, diarrhea and constipation.  Genitourinary: Negative for dysuria.  Musculoskeletal: Negative for joint pain.  Neurological: Negative for dizziness and headaches.   Exam: Physical Exam  Constitutional: He is oriented to person, place, and time.  HENT:  Nose: No mucosal edema.  Mouth/Throat: No oropharyngeal exudate or posterior oropharyngeal edema.  Eyes: Conjunctivae, EOM and lids are normal. Pupils are equal, round, and reactive to light.  Neck: No JVD present. Carotid bruit is not present. No edema present. No thyroid mass and no thyromegaly present.  Cardiovascular: S1 normal and S2 normal.  Exam reveals no gallop.   No murmur heard. Pulses:      Dorsalis pedis pulses are 2+ on the right side, and 2+ on the left side.  Respiratory: No respiratory distress. He has no wheezes. He has no rhonchi. He has no rales.  GI: Soft. Bowel sounds are normal. There is no tenderness.  Musculoskeletal:       Right ankle: He exhibits swelling.       Left ankle: He exhibits swelling.  Lymphadenopathy:    He  has no cervical adenopathy.  Neurological: He is alert and oriented to person, place, and time. No cranial nerve deficit.  Skin: Skin is warm. Nails show no clubbing.  Chronic lower extremity discoloration  Psychiatric: He has a normal mood and affect.    Data Reviewed: Basic Metabolic Panel:  Recent Labs Lab 02/07/15 0625 02/08/15 0645 02/09/15 1130 02/10/15 0140 02/11/15 0512  NA 137 132* 133* 137 138  K 4.7 4.0 3.7 3.7 3.8  CL 102 97* 98* 102 103  CO2 23 23 25 24 24   GLUCOSE 168* 173* 175* 168* 163*  BUN 61* 59* 57* 56* 52*  CREATININE 7.58* 7.55* 6.94* 6.39* 5.83*  CALCIUM 8.5* 8.6* 8.0* 8.3* 8.3*  PHOS  --   --  5.2* 5.2* 4.9*   CBC:  Recent Labs Lab 02/07/15 0625 02/09/15 1130 02/10/15 0140  WBC 15.9* 9.9 10.9*  HGB 10.6* 9.9* 10.0*  HCT 31.8* 29.0* 29.8*  MCV 84.4 84.1 84.5  PLT 176 236 255   Cardiac Enzymes: No results for input(s): CKTOTAL, CKMB, CKMBINDEX, TROPONINI in the last 168 hours. BNP (last 3 results)  Recent Labs  02/01/15 1709  BNP 223.0*    CBG:  Recent Labs Lab 02/10/15 0723 02/10/15 1207 02/10/15 1636 02/10/15 2113 02/11/15 0743  GLUCAP 149* 150* 184* 171* 158*    Recent Results (from the past 240 hour(s))  Blood Culture (routine x 2)     Status: None   Collection Time: 02/01/15  6:35 PM  Result Value Ref Range Status   Specimen Description BLOOD LEFT ARM  Final   Special Requests BOTTLES DRAWN AEROBIC AND ANAEROBIC 5ML  Final   Culture NO GROWTH 5 DAYS  Final   Report Status 02/06/2015 FINAL  Final  Blood Culture (routine x 2)     Status: None   Collection Time: 02/01/15  6:40 PM  Result Value Ref Range Status   Specimen Description BLOOD RIGHT HAND  Final   Special Requests BOTTLES DRAWN AEROBIC AND ANAEROBIC 5ML  Final   Culture NO GROWTH 5 DAYS  Final   Report Status 02/06/2015 FINAL  Final  MRSA PCR Screening     Status: None   Collection Time: 02/01/15  9:37 PM  Result Value Ref Range Status   MRSA by PCR  NEGATIVE NEGATIVE Final    Comment:        The GeneXpert MRSA Assay (FDA approved for NASAL specimens only), is one component of a comprehensive MRSA colonization surveillance program. It is not intended to diagnose MRSA infection nor to guide or monitor treatment for MRSA infections.   Urine culture     Status: None   Collection Time: 02/02/15 12:11 AM  Result Value Ref Range Status   Specimen Description URINE, RANDOM  Final   Special Requests Normal  Final   Culture NO GROWTH 2 DAYS  Final   Report Status 02/04/2015 FINAL  Final     Scheduled Meds: . aspirin  325 mg Oral Daily  . heparin  5,000 Units Subcutaneous 3 times per day  . insulin aspart  0-5 Units Subcutaneous QHS  . insulin aspart  0-9 Units Subcutaneous TID WC  . insulin glargine  6 Units Subcutaneous QHS  . polyethylene glycol  17 g Oral Daily  . sodium chloride  3 mL Intravenous Q12H  . tamsulosin  0.4 mg Oral QPC supper    Assessment/Plan:  1. Severe sepsis with hypotension and acute renal failure. Antibiotics completed. Was felt to be urinary tract source. 2. Acute renal failureon CKD stage 3-  likely ATN with sepsis and hypotension. Patient had two dialysis sessions. Renal function improving 3. Type 2 diabetes mellitus without complication. Patient on low dose Lantus and sliding scale at this point. 4. Weakness- physical therapy evaluation rec rehab only one bad offered daughter wishes for other options. 5. Morbid obesity weight loss needed 6. Likely BPH- Flomax    Disposal reevaluation physical therapy Code Status:     Code Status Orders        Start     Ordered   02/01/15 1953  Full code   Continuous     02/01/15 1953    Code Status History    Date Active Date Inactive Code Status Order ID Comments User Context   This patient has a current code status but no historical code status.    Advance Directive Documentation        Most Recent Value   Type of Advance Directive  Healthcare  Power of Attorney, Living will   Pre-existing out of facility DNR order (yellow form or pink MOST form)     "MOST" Form in Place?       Family Communication: Family yesterday Disposition Plan: To be determined depending on his kidney function  Consultants:  Nephrology  Time spent: 20 minutes  [place], [place] [facility]

## 2015-02-12 LAB — RENAL FUNCTION PANEL
ANION GAP: 8 (ref 5–15)
Albumin: 2.6 g/dL — ABNORMAL LOW (ref 3.5–5.0)
BUN: 45 mg/dL — ABNORMAL HIGH (ref 6–20)
CALCIUM: 8.4 mg/dL — AB (ref 8.9–10.3)
CO2: 26 mmol/L (ref 22–32)
CREATININE: 5.01 mg/dL — AB (ref 0.61–1.24)
Chloride: 104 mmol/L (ref 101–111)
GFR calc Af Amer: 12 mL/min — ABNORMAL LOW (ref >60)
GFR calc non Af Amer: 10 mL/min — ABNORMAL LOW (ref >60)
GLUCOSE: 210 mg/dL — AB (ref 65–99)
Phosphorus: 3.9 mg/dL (ref 2.5–4.6)
Potassium: 4 mmol/L (ref 3.5–5.1)
SODIUM: 138 mmol/L (ref 135–145)

## 2015-02-12 LAB — GLUCOSE, CAPILLARY
GLUCOSE-CAPILLARY: 191 mg/dL — AB (ref 65–99)
Glucose-Capillary: 216 mg/dL — ABNORMAL HIGH (ref 65–99)
Glucose-Capillary: 275 mg/dL — ABNORMAL HIGH (ref 65–99)
Glucose-Capillary: 313 mg/dL — ABNORMAL HIGH (ref 65–99)

## 2015-02-12 MED ORDER — PREDNISONE 50 MG PO TABS
50.0000 mg | ORAL_TABLET | Freq: Once | ORAL | Status: AC
  Administered 2015-02-12: 50 mg via ORAL
  Filled 2015-02-12: qty 1

## 2015-02-12 NOTE — Clinical Social Work Note (Signed)
CSW attempted to reach pt's daughter to provide bed offers. CSW left message for Dakota Estes, pt's daughter, requesting a return phone call. At time of note, CSW has not heard back from pt's daughter. Bed offers are available. CSW will continue to follow.   Darden Dates, MSW, LCSW  Clinical Social Worker [phone]

## 2015-02-12 NOTE — Progress Notes (Signed)
Was unable to get blood return to draw labs from trialysis catheter.  Called lab to send phlebotomist to draw labs.

## 2015-02-12 NOTE — Progress Notes (Signed)
[facility]  ROUNDING NOTE   Subjective:   Family at bedside.  Creatinine 5.01 (5.83) (6.39)  Objective:  Vital signs in last 24 hours:  Temp:  [98.3 F (36.8 C)-98.8 F (37.1 C)] 98.3 F (36.8 C) (01/29 0537) Pulse Rate:  [82-90] 86 (01/29 0537) Resp:  [18-19] 18 (01/29 0537) BP: (128-178)/(67-80) 155/67 mmHg (01/29 0537) SpO2:  [93 %-100 %] 96 % (01/29 0537)  Weight change:  Filed Weights   02/08/15 1132 02/08/15 1352 02/11/15 1100  Weight: 192.07 kg (423 lb 7 oz) 191.872 kg (423 lb) 194.593 kg (429 lb)    Intake/Output: I/O last 3 completed shifts: In: 1560 [P.O.:1560] Out: 3220 [id number]   Intake/Output this shift:  Total I/O In: 250 [P.O.:240; I.V.:10] Out: 275 [Urine:275]  Physical Exam: General: Obese, NAD  Head: Normocephalic, atraumatic. Moist oral mucosal membranes  Eyes: Anicteric  Neck: Supple, trachea midline  Lungs:  Clear to auscultation, normal effort  Heart: Regular rate and rhythm  Abdomen:  Soft, mild lower abdominal tenderness, BS present  Extremities: trace peripheral edema.  Neurologic: Nonfocal, moving all four extremities  Skin: No lesions  Access: L IJ temporary dialysis catheter Dr. Delana Meyer 02/04/15, RIJ permcath 02/08/15 Dr. Delana Meyer    Basic Metabolic Panel:  Recent Labs Lab 02/08/15 0645 02/09/15 1130 02/10/15 0140 02/11/15 0512 02/12/15 0925  NA 132* 133* 137 138 138  K 4.0 3.7 3.7 3.8 4.0  CL 97* 98* 102 103 104  CO2 23 25 24 24 26   GLUCOSE 173* 175* 168* 163* 210*  BUN 59* 57* 56* 52* 45*  CREATININE 7.55* 6.94* 6.39* 5.83* 5.01*  CALCIUM 8.6* 8.0* 8.3* 8.3* 8.4*  PHOS  --  5.2* 5.2* 4.9* 3.9    Liver Function Tests:  Recent Labs Lab 02/09/15 1130 02/10/15 0140 02/11/15 0512 02/12/15 0925  ALBUMIN 2.5* 2.6* 2.6* 2.6*   No results for input(s): LIPASE, AMYLASE in the last 168 hours. No results for input(s): AMMONIA in the last 168 hours.  CBC:  Recent Labs Lab 02/07/15 0625  02/09/15 1130 02/10/15 0140  WBC 15.9* 9.9 10.9*  HGB 10.6* 9.9* 10.0*  HCT 31.8* 29.0* 29.8*  MCV 84.4 84.1 84.5  PLT 176 236 255    Cardiac Enzymes: No results for input(s): CKTOTAL, CKMB, CKMBINDEX, TROPONINI in the last 168 hours.  BNP: Invalid input(s): POCBNP  CBG:  Recent Labs Lab 02/11/15 1140 02/11/15 1648 02/11/15 2108 02/12/15 0737 02/12/15 1128  GLUCAP 206* 195* 182* 191* 216*    Microbiology: Results for orders placed or performed during the hospital encounter of 02/01/15  Blood Culture (routine x 2)     Status: None   Collection Time: 02/01/15  6:35 PM  Result Value Ref Range Status   Specimen Description BLOOD LEFT ARM  Final   Special Requests BOTTLES DRAWN AEROBIC AND ANAEROBIC 5ML  Final   Culture NO GROWTH 5 DAYS  Final   Report Status 02/06/2015 FINAL  Final  Blood Culture (routine x 2)     Status: None   Collection Time: 02/01/15  6:40 PM  Result Value Ref Range Status   Specimen Description BLOOD RIGHT HAND  Final   Special Requests BOTTLES DRAWN AEROBIC AND ANAEROBIC 5ML  Final   Culture NO GROWTH 5 DAYS  Final   Report Status 02/06/2015 FINAL  Final  MRSA PCR Screening     Status: None   Collection Time: 02/01/15  9:37 PM  Result Value Ref Range Status   MRSA by PCR NEGATIVE NEGATIVE  Final    Comment:        The GeneXpert MRSA Assay (FDA approved for NASAL specimens only), is one component of a comprehensive MRSA colonization surveillance program. It is not intended to diagnose MRSA infection nor to guide or monitor treatment for MRSA infections.   Urine culture     Status: None   Collection Time: 02/02/15 12:11 AM  Result Value Ref Range Status   Specimen Description URINE, RANDOM  Final   Special Requests Normal  Final   Culture NO GROWTH 2 DAYS  Final   Report Status 02/04/2015 FINAL  Final    Coagulation Studies: No results for input(s): LABPROT, INR in the last 72 hours.  Urinalysis: No results for input(s):  COLORURINE, LABSPEC, PHURINE, GLUCOSEU, HGBUR, BILIRUBINUR, KETONESUR, PROTEINUR, UROBILINOGEN, NITRITE, LEUKOCYTESUR in the last 72 hours.  Invalid input(s): APPERANCEUR    Imaging: No results found.   Medications:     . aspirin  325 mg Oral Daily  . heparin  5,000 Units Subcutaneous 3 times per day  . insulin aspart  0-5 Units Subcutaneous QHS  . insulin aspart  0-9 Units Subcutaneous TID WC  . insulin glargine  6 Units Subcutaneous QHS  . polyethylene glycol  17 g Oral Daily  . sodium chloride  3 mL Intravenous Q12H  . tamsulosin  0.4 mg Oral QPC supper   sodium chloride, sodium chloride, acetaminophen **OR** acetaminophen, alteplase, pentafluoroprop-tetrafluoroeth, zolpidem  Assessment/ Plan:  75 y.o. male  with a PMHx of diabetes mellitus, hypertension, rheumatoid arthritis, congenitally atrophic right kidney who was admitted to [facility] on 02/01/2015 for evaluation of shortness of breath and lower abdominal pain.   1. Acute renal failure with proteinuria:  Baseline creatinine 1.1. Acute renal failure secondary to ATN. Ultrasound was negative for hydronephrosis. He is known to have a congenitally absent/very atrophic right kidney.  He has completed 2 treatments of hemodialysis. Last hemodialysis was 02/05/15. Tolerated treatments well. Now with nonoliguric urine output.  Will monitor daily for dialysis need.  Continue hold dialysis and monitor for recovery. Creatinine is improving - Currently with permcath. Remove temp HD catheter in [facility]   2. Urinary tract infection. Pyuria and hematuria noted. TNTC RBCs/WBCs,  Urine culture no growth Completed course of ceftriaxone   3. Anemia with acute renal failure:  Hemoglobin 10. Platelets improved SPEP and UPEP negative. - no epo administered.   4. Hypertension: blood pressure with some elevations - holding ramipril due to acute renal failure.   5. Diabetes Mellitus type II with renal manifestations of proteinuria and glucosuria:  Insulin dependent - Continue glucose control.    LOS: [place], [place] 1/29/201712:21 PM

## 2015-02-12 NOTE — Progress Notes (Signed)
Left jugular temporary dialysis catheter was removed per Dr. Assunta Gambles order.  Pressure was held for 20 minutes.  There was no bleeding.  A clean, dry dressing was applied with vaseline gauze underneath.  Patient tolerated procedure well.

## 2015-02-13 LAB — RENAL FUNCTION PANEL
ALBUMIN: 2.8 g/dL — AB (ref 3.5–5.0)
Anion gap: 6 (ref 5–15)
BUN: 46 mg/dL — AB (ref 6–20)
CALCIUM: 8.6 mg/dL — AB (ref 8.9–10.3)
CHLORIDE: 106 mmol/L (ref 101–111)
CO2: 26 mmol/L (ref 22–32)
CREATININE: 4.19 mg/dL — AB (ref 0.61–1.24)
GFR, EST AFRICAN AMERICAN: 15 mL/min — AB (ref >60)
GFR, EST NON AFRICAN AMERICAN: 13 mL/min — AB (ref >60)
Glucose, Bld: 184 mg/dL — ABNORMAL HIGH (ref 65–99)
Phosphorus: 3.8 mg/dL (ref 2.5–4.6)
Potassium: 4.3 mmol/L (ref 3.5–5.1)
SODIUM: 138 mmol/L (ref 135–145)

## 2015-02-13 LAB — CBC WITH DIFFERENTIAL/PLATELET
Basophils Absolute: 0.1 10*3/uL (ref 0–0.1)
Basophils Relative: 1 %
EOS PCT: 3 %
Eosinophils Absolute: 0.3 10*3/uL (ref 0–0.7)
HCT: 29.2 % — ABNORMAL LOW (ref 40.0–52.0)
HEMOGLOBIN: 9.7 g/dL — AB (ref 13.0–18.0)
LYMPHS ABS: 1.7 10*3/uL (ref 1.0–3.6)
Lymphocytes Relative: 15 %
MCH: 28.4 pg (ref 26.0–34.0)
MCHC: 33.2 g/dL (ref 32.0–36.0)
MCV: 85.6 fL (ref 80.0–100.0)
MONOS PCT: 9 %
Monocytes Absolute: 1 10*3/uL (ref 0.2–1.0)
NEUTROS PCT: 72 %
Neutro Abs: 8 10*3/uL — ABNORMAL HIGH (ref 1.4–6.5)
Platelets: 271 10*3/uL (ref 150–440)
RBC: 3.41 MIL/uL — AB (ref 4.40–5.90)
RDW: 14.6 % — ABNORMAL HIGH (ref 11.5–14.5)
WBC: 11 10*3/uL — AB (ref 3.8–10.6)

## 2015-02-13 LAB — GLUCOSE, CAPILLARY
Glucose-Capillary: 168 mg/dL — ABNORMAL HIGH (ref 65–99)
Glucose-Capillary: 186 mg/dL — ABNORMAL HIGH (ref 65–99)

## 2015-02-13 MED ORDER — INSULIN GLARGINE 100 UNIT/ML ~~LOC~~ SOLN: 20.0000 [IU] | Freq: Every day | SUBCUTANEOUS | Status: DC

## 2015-02-13 MED ORDER — TAMSULOSIN HCL 0.4 MG PO CAPS: 0.4000 mg | ORAL_CAPSULE | Freq: Every day | ORAL | Status: AC

## 2015-02-13 MED ORDER — INSULIN GLARGINE 100 UNIT/ML ~~LOC~~ SOLN: 12.0000 [IU] | Freq: Every day | SUBCUTANEOUS | Status: AC

## 2015-02-13 NOTE — Clinical Social Work Note (Signed)
CSW unable to reach patient's daughter this morning and another message has been left. CSW spoke with patient who is alert and oriented X4 and patient informed CSW that he will not go to [facility] ([place] had informed him that two facilities in [place] per daughter's request could offer) and that he will go to [facility] as all of his support systems are here in [place]. [facility] has stated that they have received auth from [facility] [employer] MD ready to discharge. Patient stated to [place] "I am a grown man of 75 years old and I do not need to let my daughter know if I am discharging to [facility]." Discharge information sent to [facility]. Patient states he wishes to wait on his significant other to get here though to the hospital. Shela Leff MSW,LCSW [phone]

## 2015-02-13 NOTE — Discharge Summary (Addendum)
Dakota Estes, 75 y.o., DOB 22-Nov-1940, MRN [id number]. Admission date: 02/01/2015 Discharge Date 02/13/2015 Primary MD No primary care provider on file. Admitting Physician Loletha Grayer, MD  Admission Diagnosis  Troponin level elevated [R79.89] Sepsis, due to unspecified organism [facility]) [A41.9] Acute renal failure, unspecified acute renal failure type ([place]) [N17.9]  Discharge Diagnosis   Active Problems:  Acute renal failure due to ATN Sepsis due to urinary tract infection Anemia likely due to anemia of chronic disease Hypertension Diabetes type 2 Morbid obesity Congenital absence of one kidney Rheumatoid arthritis Rash unclear cause    Hospital Course  Dakota Estes is a 75 y.o. male presented to the emergency room with a shaking chill.  Patient noted to ER was found to have an elevated white count of 26.8, high lactic acid of 5.2, and acute renal failure and he was also hypotensive. Patient was treated with antibiotics for presumed urinary sepsis. He did not have much urine to send for culture. Patient was continued on antibiotics. He completed a course of therapy. He has not had any fevers or chills. Patient for his renal failure was treated with supportive care. His renal function continued to improve. He did require 2HD. His creatinine currently is in the 4 range. He was thought to have ATN. Patient is doing much better he is very weak and deconditioned in need of rehabilitation. Which is currently being arranged.            Consults  nephrology  Significant Tests:  See full reports for all details      Dg Chest 1 View  02/04/2015  CLINICAL DATA:  Central line placement EXAM: CHEST  1 VIEW COMPARISON:  02/01/2015 FINDINGS: Cardiac shadow is stable. Central vascular congestion is noted. No focal infiltrate is seen. A left jugular central line is noted with the catheter tip in the proximal superior vena cava. No pneumothorax is noted. IMPRESSION: Status post central  line placement as described above. No pneumothorax is seen. Increased vascular congestion is noted centrally. Electronically Signed   By: Inez Catalina M.D.   On: 02/04/2015 15:56   Dg Chest 2 View  02/01/2015  CLINICAL DATA:  Worsening shortness of breath and abdominal pain since 01/29/2015. Initial encounter. EXAM: CHEST  2 VIEW COMPARISON:  CT chest 10/08/2010. FINDINGS: Studies limb about the patient's size. The lungs appear clear. Heart size is normal. No pneumothorax or pleural effusion. IMPRESSION: No acute disease. Electronically Signed   By: Inge Rise M.D.   On: 02/01/2015 15:14   US Renal  02/02/2015  CLINICAL DATA:  Acute kidney injury. 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 EXAM: RENAL / URINARY TRACT ULTRASOUND COMPLETE COMPARISON:  03/18/2011 CT FINDINGS: Right Kidney: Length: Not visualized. Diminutive right kidney identified by previous CT is not visualized today. Left Kidney: Length: 16.8 cm. Echogenicity is normal. No hydronephrosis identified. Upper pole hypoechoic mass is 5.0 x 6.2 x 4.7 cm. Findings are consistent with benign, stable cyst compared with previous CT exam. Bladder: Bladder is decompressed by a Foley catheter, limiting evaluation of the bladder. Additional: Study quality is degraded by patient body habitus. IMPRESSION: 1. Nonvisualized right kidney, known to be diminutive based on prior CT exam. 2. No hydronephrosis on the left. 3. Upper pole left renal cyst. Electronically Signed   By: Nolon Nations M.D.   On: 02/02/2015 09:30       Today   Subjective:   Dakota Estes  feels okay denies any chest pain or shortness  of breath  Objective:   Blood pressure 129/58, pulse 82, temperature 97.9 F (36.6 C), temperature source Oral, resp. rate 18, height 6\' 5"  (1.956 m), weight 194.593 kg (429 lb), SpO2 99 %.  .  Intake/Output Summary (Last 24 hours) at 02/13/15 1110 Last data filed at 02/13/15 0507  Gross per 24 hour  Intake    960 ml  Output    1100 ml  Net   -140 ml    Exam VITAL SIGNS: Blood pressure 129/58, pulse 82, temperature 97.9 F (36.6 C), temperature source Oral, resp. rate 18, height 6\' 5"  (1.956 m), weight 194.593 kg (429 lb), SpO2 99 %.  GENERAL:  75 y.o.-year-old patient lying in the bed with no acute distress.  EYES: Pupils equal, round, reactive to light and accommodation. No scleral icterus. Extraocular muscles intact.  HEENT: Head atraumatic, normocephalic. Oropharynx and nasopharynx clear.  NECK:  Supple, no jugular venous distention. No thyroid enlargement, no tenderness.  LUNGS: Normal breath sounds bilaterally, no wheezing, rales,rhonchi or crepitation. No use of accessory muscles of respiration.  CARDIOVASCULAR: S1, S2 normal. No murmurs, rubs, or gallops.  ABDOMEN: Soft, nontender, nondistended. Bowel sounds present. No organomegaly or mass.  EXTREMITIES: No pedal edema, cyanosis, or clubbing.  NEUROLOGIC: Cranial nerves II through XII are intact. Muscle strength 5/5 in all extremities. Sensation intact. Gait not checked.  PSYCHIATRIC: The patient is alert and oriented x 3.  SKIN: No obvious rash, lesion, or ulcer.   Data Review     CBC w Diff:  Lab Results  Component Value Date   WBC 11.0* 02/13/2015   HGB 9.7* 02/13/2015   HCT 29.2* 02/13/2015   PLT 271 02/13/2015   LYMPHOPCT 15 02/13/2015   MONOPCT 9 02/13/2015   EOSPCT 3 02/13/2015   BASOPCT 1 02/13/2015   CMP:  Lab Results  Component Value Date   NA 138 02/13/2015   K 4.3 02/13/2015   CL 106 02/13/2015   CO2 26 02/13/2015   BUN 46* 02/13/2015   CREATININE 4.19* 02/13/2015   ALBUMIN 2.8* 02/13/2015  .  Micro Results No results found for this or any previous visit (from the past 240 hour(s)).      Code Status Orders        Start     Ordered   02/01/15 1953  Full code   Continuous     02/01/15 1953    Code Status History    Date Active Date Inactive Code Status Order ID Comments User Context   This patient has a  current code status but no historical code status.    Advance Directive Documentation        Most Recent Value   Type of Advance Directive  Healthcare Power of Attorney, Living will   Pre-existing out of facility DNR order (yellow form or pink MOST form)     "MOST" Form in Place?                Follow-up Information    Follow up with Lavonia Dana, MD In 5 days.   Specialty:  Internal Medicine   Contact information:   [address] [place] [address] [phone]       Discharge Medications     Medication List    STOP taking these medications        insulin lispro 100 UNIT/ML injection  Commonly known as:  HUMALOG     insulin NPH Human 100 UNIT/ML injection  Commonly known as:  HUMULIN N,NOVOLIN N     ramipril 10 MG capsule  Commonly known as:  ALTACE      TAKE these medications        aspirin 325 MG tablet  Take 325 mg by mouth daily.     doxazosin 4 MG tablet  Commonly known as:  CARDURA  Take 4 mg by mouth at bedtime.     insulin glargine 100 UNIT/ML injection  Commonly known as:  LANTUS  Inject 0.12 mLs (12 Units total) into the skin at bedtime.     tamsulosin 0.4 MG Caps capsule  Commonly known as:  FLOMAX  Take 1 capsule (0.4 mg total) by mouth daily after supper.           Total Time in preparing paper work, data evaluation and todays exam - 35 minutes  Dustin Flock M.D on 02/13/2015 at 11:10 AM  [place]  [phone]

## 2015-02-13 NOTE — Discharge Instructions (Signed)
°  DIET:  Diabetic diet  DISCHARGE CONDITION:  Stable  ACTIVITY:  Activity as tolerated with assitance  OXYGEN:  Home Oxygen: No.   Oxygen Delivery: room air  DISCHARGE LOCATION:  nursing home    ADDITIONAL DISCHARGE INSTRUCTION:   If you experience worsening of your admission symptoms, develop shortness of breath, life threatening emergency, suicidal or homicidal thoughts you must seek medical attention immediately by calling 911 or calling your MD immediately  if symptoms less severe.  You Must read complete instructions/literature along with all the possible adverse reactions/side effects for all the Medicines you take and that have been prescribed to you. Take any new Medicines after you have completely understood and accpet all the possible adverse reactions/side effects.   Please note  You were cared for by a hospitalist during your hospital stay. If you have any questions about your discharge medications or the care you received while you were in the hospital after you are discharged, you can call the unit and asked to speak with the hospitalist on call if the hospitalist that took care of you is not available. Once you are discharged, your primary care physician will handle any further medical issues. Please note that NO REFILLS for any discharge medications will be authorized once you are discharged, as it is imperative that you return to your primary care physician (or establish a relationship with a primary care physician if you do not have one) for your aftercare needs so that they can reassess your need for medications and monitor your lab values.

## 2015-02-13 NOTE — Clinical Social Work Note (Signed)
Patient informed CSW that he would transport via family and did not want to ride in an EMS. Shela Leff MSW,LCSW [phone]

## 2015-02-13 NOTE — Progress Notes (Signed)
02/13/2015  1:03 PM  [place] receiving nurse and gave report.  I answered all of her questions and discussed the course of his hospital stay, as well as concerns.  Dola Argyle, RN

## 2015-02-13 NOTE — Care Management Important Message (Signed)
Important Message  Patient Details  Name: Dakota Estes MRN: [id number] Date of Birth: 1940/07/07   Medicare Important Message Given:  Yes    Leylah Tarnow A, RN 02/13/2015, 8:12 AM

## 2015-02-13 NOTE — Progress Notes (Signed)
Inpatient Diabetes Program Recommendations  AACE/ADA: New Consensus Statement on Inpatient Glycemic Control (2015)  Target Ranges:  Prepandial:   less than 140 mg/dL      Peak postprandial:   less than 180 mg/dL (1-2 hours)      Critically ill patients:  140 - 180 mg/dL   Review of Glycemic Control  Results for Dakota Estes, Dakota Estes (MRN [id number]) as of 02/13/2015 11:31  Ref. Range 02/12/2015 07:37 02/12/2015 11:28 02/12/2015 16:39 02/12/2015 20:41 02/13/2015 07:50  Glucose-Capillary Latest Ref Range: 65-99 mg/dL 191 (H) 216 (H) 275 (H) 313 (H) 168 (H)    Diabetes history: Type 2 Outpatient Diabetes medications: CONFIRMED WITH PATIENT- NPH insulin 60 units bid, Humalog 46units qam, 40 units q lunch (only if he eats), 46 units q supper Current orders for Inpatient glycemic control: Lantus 6 units qhs, Novolog 0-9 units tid, Novolog 0-5 units qhs  Inpatient Diabetes Program Recommendations:  Spoke with patient- he confirms home doses of insulin- discharge orders written but concerned that his pre-hospital admission doses of insulin will be far too high once he leaves here since he's required little insulin while he was in hospital.   Text paged Dr. Posey Pronto at 11:39am  Gentry Fitz, RN, [place], [place], CDE Diabetes Coordinator Inpatient Diabetes Program  [phone] (Team Pager) [phone] ([place]) 02/13/2015 11:41 AM

## 2015-02-13 NOTE — Clinical Social Work Placement (Signed)
   CLINICAL SOCIAL WORK PLACEMENT  NOTE  Date:  02/13/2015  Patient Details  Name: Dakota Estes MRN: [id number] Date of Birth: 1940/09/21  Clinical Social Work is seeking post-discharge placement for this patient at the [place] level of care (*CSW will initial, date and re-position this form in  chart as items are completed):  Yes   Patient/family provided with [facility] Work Department's list of facilities offering this level of care within the geographic area requested by the patient (or if unable, by the patient's family).  Yes   Patient/family informed of their freedom to choose among providers that offer the needed level of care, that participate in Medicare, Medicaid or managed care program needed by the patient, have an available bed and are willing to accept the patient.  Yes   Patient/family informed of [facility]'s ownership interest in [facility] and [facility], as well as of the fact that they are under no obligation to receive care at these facilities.  PASRR submitted to EDS on 02/10/15     PASRR number received on 02/10/15     Existing PASRR number confirmed on       FL2 transmitted to all facilities in geographic area requested by pt/family on 02/10/15     FL2 transmitted to all facilities within larger geographic area on       Patient informed that his/her managed care company has contracts with or will negotiate with certain facilities, including the following:        Yes   Patient/family informed of bed offers received.  Patient chooses bed at  [facility])     Physician recommends and patient chooses bed at  [facility])    Patient to be transferred to  [facility]) on 02/13/15.  Patient to be transferred to facility by  (personal vehicle)     Patient family notified on 02/13/15 of transfer.  Name of family member notified:  message left for daughter, but patient distinctly made it known that his  daughter did not need to know when he was leaving      PHYSICIAN       Additional Comment:    _______________________________________________ Shela Leff, LCSW 02/13/2015, 3:15 PM

## 2015-02-13 NOTE — Progress Notes (Signed)
02/13/2015  1:45  Hassell Halim to be D/C'd Rehab per MD order.  Discussed prescriptions and follow up appointments with the patient. Prescriptions given to patient, medication list explained in detail. Pt verbalized understanding.    Medication List    STOP taking these medications        insulin lispro 100 UNIT/ML injection  Commonly known as:  HUMALOG     insulin NPH Human 100 UNIT/ML injection  Commonly known as:  HUMULIN N,NOVOLIN N     ramipril 10 MG capsule  Commonly known as:  ALTACE      TAKE these medications        aspirin 325 MG tablet  Take 325 mg by mouth daily.     doxazosin 4 MG tablet  Commonly known as:  CARDURA  Take 4 mg by mouth at bedtime.     insulin glargine 100 UNIT/ML injection  Commonly known as:  LANTUS  Inject 0.12 mLs (12 Units total) into the skin at bedtime.     tamsulosin 0.4 MG Caps capsule  Commonly known as:  FLOMAX  Take 1 capsule (0.4 mg total) by mouth daily after supper.        Filed Vitals:   02/13/15 0931 02/13/15 1259  BP: 129/58 170/71  Pulse: 82 70  Temp: 97.9 F (36.6 C) 98.2 F (36.8 C)  Resp: 18 16    Skin clean, dry and intact without evidence of skin break down, no evidence of skin tears noted. IV catheter discontinued intact. Site without signs and symptoms of complications. Dressing and pressure applied. Pt denies pain at this time. No complaints noted.   Patient escorted via Fitzgerald, and D/C home via private auto.  Dola Argyle

## 2015-02-13 NOTE — Progress Notes (Signed)
[facility]  ROUNDING NOTE   Subjective:     Creatinine 4.19(5.01) (5.83) (6.39) UOP 1375 cc  Objective:  Vital signs in last 24 hours:  Temp:  [97.9 F (36.6 C)-99 F (37.2 C)] 97.9 F (36.6 C) (01/30 0931) Pulse Rate:  [71-85] 82 (01/30 0931) Resp:  [16-19] 18 (01/30 0931) BP: (129-156)/(58-99) 129/58 mmHg (01/30 0931) SpO2:  [92 %-99 %] 99 % (01/30 0931)  Weight change:  Filed Weights   02/08/15 1132 02/08/15 1352 02/11/15 1100  Weight: 192.07 kg (423 lb 7 oz) 191.872 kg (423 lb) 194.593 kg (429 lb)    Intake/Output: I/O last 3 completed shifts: In: 2050 [P.O.:2040; I.V.:10] Out: 3125 [Urine:3125]   Intake/Output this shift:     Physical Exam: General: Obese, NAD  Head: Normocephalic, atraumatic. Moist oral mucosal membranes  Eyes: Anicteric  Neck: Supple, trachea midline  Lungs:  Clear to auscultation, normal effort  Heart: Regular rate and rhythm  Abdomen:  Soft, mild lower abdominal tenderness, BS present  Extremities: trace peripheral edema.  Neurologic: Nonfocal, moving all four extremities  Skin: No lesions  Access:     Basic Metabolic Panel:  Recent Labs Lab 02/09/15 1130 02/10/15 0140 02/11/15 0512 02/12/15 0925 02/13/15 0550  NA 133* 137 138 138 138  K 3.7 3.7 3.8 4.0 4.3  CL 98* 102 103 104 106  CO2 25 24 24 26 26   GLUCOSE 175* 168* 163* 210* 184*  BUN 57* 56* 52* 45* 46*  CREATININE 6.94* 6.39* 5.83* 5.01* 4.19*  CALCIUM 8.0* 8.3* 8.3* 8.4* 8.6*  PHOS 5.2* 5.2* 4.9* 3.9 3.8    Liver Function Tests:  Recent Labs Lab 02/09/15 1130 02/10/15 0140 02/11/15 0512 02/12/15 0925 02/13/15 0550  ALBUMIN 2.5* 2.6* 2.6* 2.6* 2.8*   No results for input(s): LIPASE, AMYLASE in the last 168 hours. No results for input(s): AMMONIA in the last 168 hours.  CBC:  Recent Labs Lab 02/07/15 0625 02/09/15 1130 02/10/15 0140 02/13/15 0550  WBC 15.9* 9.9 10.9* 11.0*  NEUTROABS  --   --   --  8.0*  HGB 10.6* 9.9* 10.0* 9.7*   HCT 31.8* 29.0* 29.8* 29.2*  MCV 84.4 84.1 84.5 85.6  PLT 176 236 255 271    Cardiac Enzymes: No results for input(s): CKTOTAL, CKMB, CKMBINDEX, TROPONINI in the last 168 hours.  BNP: Invalid input(s): POCBNP  CBG:  Recent Labs Lab 02/12/15 1128 02/12/15 1639 02/12/15 2041 02/13/15 0750 02/13/15 1148  GLUCAP 216* 275* 313* 168* 186*    Microbiology: Results for orders placed or performed during the hospital encounter of 02/01/15  Blood Culture (routine x 2)     Status: None   Collection Time: 02/01/15  6:35 PM  Result Value Ref Range Status   Specimen Description BLOOD LEFT ARM  Final   Special Requests BOTTLES DRAWN AEROBIC AND ANAEROBIC 5ML  Final   Culture NO GROWTH 5 DAYS  Final   Report Status 02/06/2015 FINAL  Final  Blood Culture (routine x 2)     Status: None   Collection Time: 02/01/15  6:40 PM  Result Value Ref Range Status   Specimen Description BLOOD RIGHT HAND  Final   Special Requests BOTTLES DRAWN AEROBIC AND ANAEROBIC 5ML  Final   Culture NO GROWTH 5 DAYS  Final   Report Status 02/06/2015 FINAL  Final  MRSA PCR Screening     Status: None   Collection Time: 02/01/15  9:37 PM  Result Value Ref Range Status   MRSA by  PCR NEGATIVE NEGATIVE Final    Comment:        The GeneXpert MRSA Assay (FDA approved for NASAL specimens only), is one component of a comprehensive MRSA colonization surveillance program. It is not intended to diagnose MRSA infection nor to guide or monitor treatment for MRSA infections.   Urine culture     Status: None   Collection Time: 02/02/15 12:11 AM  Result Value Ref Range Status   Specimen Description URINE, RANDOM  Final   Special Requests Normal  Final   Culture NO GROWTH 2 DAYS  Final   Report Status 02/04/2015 FINAL  Final    Coagulation Studies: No results for input(s): LABPROT, INR in the last 72 hours.  Urinalysis: No results for input(s): COLORURINE, LABSPEC, PHURINE, GLUCOSEU, HGBUR, BILIRUBINUR,  KETONESUR, PROTEINUR, UROBILINOGEN, NITRITE, LEUKOCYTESUR in the last 72 hours.  Invalid input(s): APPERANCEUR    Imaging: No results found.   Medications:     . aspirin  325 mg Oral Daily  . heparin  5,000 Units Subcutaneous 3 times per day  . insulin aspart  0-5 Units Subcutaneous QHS  . insulin aspart  0-9 Units Subcutaneous TID WC  . insulin glargine  6 Units Subcutaneous QHS  . polyethylene glycol  17 g Oral Daily  . sodium chloride  3 mL Intravenous Q12H  . tamsulosin  0.4 mg Oral QPC supper   sodium chloride, sodium chloride, acetaminophen **OR** acetaminophen, alteplase, pentafluoroprop-tetrafluoroeth, zolpidem  Assessment/ Plan:  75 y.o. male  with a PMHx of diabetes mellitus, hypertension, rheumatoid arthritis, congenitally atrophic right kidney who was admitted to [facility] on 02/01/2015 for evaluation of shortness of breath and lower abdominal pain.   1. Acute renal failure with proteinuria:  Baseline creatinine 1.1. Acute renal failure secondary to ATN. Ultrasound was negative for hydronephrosis. He is known to have a congenitally absent/very atrophic right kidney.  He has completed 2 treatments of hemodialysis. Last hemodialysis was 02/05/15. Tolerated treatments well. Now with nonoliguric urine output.  - may d/c to rehab with permcath - we will monitor renal function as outpatient - we will decide the timing of permcath removal based on outpatient labs  2. Urinary tract infection. Pyuria and hematuria noted. TNTC RBCs/WBCs,  Urine culture no growth Completed course of ceftriaxone   3. Anemia with acute renal failure:  Hemoglobin 9.7. Platelets improved SPEP and UPEP negative. - no epo administered.   4. Hypertension: blood pressure with some elevations - holding ramipril due to acute renal failure.   5. 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    LOS:  12 Dakota Estes 1/30/201712:39 PM

## 2015-03-13 ENCOUNTER — Other Ambulatory Visit: Payer: Self-pay | Admitting: Vascular Surgery

## 2015-03-14 ENCOUNTER — Encounter
Admission: RE | Disposition: A | Discharge status: Home / Self Care | Payer: Self-pay | Source: Ambulatory Visit | Attending: Vascular Surgery

## 2015-03-14 ENCOUNTER — Ambulatory Visit
Admission: RE | Admit: 2015-03-14 | Discharge: 2015-03-14 | Disposition: A | Discharge status: Home / Self Care | Payer: Commercial Managed Care - HMO | Source: Ambulatory Visit | Attending: Vascular Surgery | Admitting: Vascular Surgery

## 2015-03-14 ENCOUNTER — Encounter: Payer: Self-pay | Admitting: Vascular Surgery

## 2015-03-14 DIAGNOSIS — I82409 Acute embolism and thrombosis of unspecified deep veins of unspecified lower extremity: Secondary | ICD-10-CM | POA: Insufficient documentation

## 2015-03-14 DIAGNOSIS — T829XXA Unspecified complication of cardiac and vascular prosthetic device, implant and graft, initial encounter: Secondary | ICD-10-CM | POA: Insufficient documentation

## 2015-03-14 DIAGNOSIS — N183 Chronic kidney disease, stage 3 (moderate): Secondary | ICD-10-CM | POA: Insufficient documentation

## 2015-03-14 DIAGNOSIS — E1122 Type 2 diabetes mellitus with diabetic chronic kidney disease: Secondary | ICD-10-CM | POA: Insufficient documentation

## 2015-03-14 DIAGNOSIS — Z794 Long term (current) use of insulin: Secondary | ICD-10-CM | POA: Diagnosis not present

## 2015-03-14 DIAGNOSIS — Z6841 Body Mass Index (BMI) 40.0 and over, adult: Secondary | ICD-10-CM | POA: Diagnosis not present

## 2015-03-14 DIAGNOSIS — E785 Hyperlipidemia, unspecified: Secondary | ICD-10-CM | POA: Insufficient documentation

## 2015-03-14 DIAGNOSIS — Z7982 Long term (current) use of aspirin: Secondary | ICD-10-CM | POA: Insufficient documentation

## 2015-03-14 DIAGNOSIS — I129 Hypertensive chronic kidney disease with stage 1 through stage 4 chronic kidney disease, or unspecified chronic kidney disease: Secondary | ICD-10-CM | POA: Insufficient documentation

## 2015-03-14 DIAGNOSIS — X58XXXA Exposure to other specified factors, initial encounter: Secondary | ICD-10-CM | POA: Insufficient documentation

## 2015-03-14 HISTORY — PX: PERIPHERAL VASCULAR CATHETERIZATION: SHX172C

## 2015-03-14 SURGERY — DIALYSIS/PERMA CATHETER REMOVAL
Anesthesia: Moderate Sedation

## 2015-03-14 MED ORDER — HYDROMORPHONE HCL 1 MG/ML IJ SOLN: 1.0000 mg | Freq: Once | INTRAMUSCULAR | Status: DC

## 2015-03-14 MED ORDER — ONDANSETRON HCL 4 MG/2ML IJ SOLN: 4.0000 mg | Freq: Four times a day (QID) | INTRAMUSCULAR | Status: AC | PRN

## 2015-03-14 MED ORDER — ONDANSETRON HCL 4 MG/2ML IJ SOLN: 4.0000 mg | Freq: Four times a day (QID) | INTRAMUSCULAR | Status: DC | PRN

## 2015-03-14 MED ORDER — LIDOCAINE-EPINEPHRINE (PF) 1 %-1:200000 IJ SOLN
INTRAMUSCULAR | Status: DC | PRN
  Administered 2015-03-14: 20 mL via INTRADERMAL

## 2015-03-14 SURGICAL SUPPLY — 8 items
DERMABOND ADVANCED (GAUZE/BANDAGES/DRESSINGS) ×2
DERMABOND ADVANCED .7 DNX12 (GAUZE/BANDAGES/DRESSINGS) ×1 IMPLANT
FCP FG STRG 5.5XNS LF DISP (INSTRUMENTS) ×1
FORCEPS FG STRG 5.5XNS LF DISP (INSTRUMENTS) ×1 IMPLANT
FORCEPS KELLY 5.5 STR (INSTRUMENTS) ×2
PREP CHG 10.5 TEAL (MISCELLANEOUS) ×3 IMPLANT
SUT MNCRL AB 4-0 PS2 18 (SUTURE) ×3 IMPLANT
TRAY LACERAT/PLASTIC (MISCELLANEOUS) ×3 IMPLANT

## 2015-03-14 NOTE — Op Note (Signed)
  OPERATIVE NOTE   PROCEDURE: 1. Removal of a right IJ tunneled dialysis catheter  PRE-OPERATIVE DIAGNOSIS: Complication of dialysis catheter  POST-OPERATIVE DIAGNOSIS: Same  SURGEON: Edwyna Dangerfield, Dolores Lory  ANESTHESIA: Local anesthetic with 1% lidocaine with epinephrine   ESTIMATED BLOOD LOSS: Minimal   FINDING(S): 1. Catheter intact   SPECIMEN(S):  Catheter  INDICATIONS:   Dakota Estes is a 75 y.o. male who presents with recovery of his renal function he now no longer requires dialysis.  DESCRIPTION: After obtaining full informed written consent, the patient was positioned supine. The right IJ catheter and surrounding area is prepped and draped in a sterile fashion. The cuff was localized by palpation and noted to be greater than 3 cm from the exit site. After appropriate timeout is called, 1% lidocaine with epinephrine is infiltrated into the surrounding tissues around the cuff. Small transverse incision is created with an 11 blade scalpel and the dissection was carried down to expose the cuff of the tunneled catheter.  The catheter is then freed from the surrounding attachments and adhesions. Once the catheter has been freed circumferentially it is transected just distal to the cuff and subsequently removed in 2 pieces. Light pressure was held at the base of the neck. A 4-0 Monocryl was used close the tunnel in the subcutaneous space. The 4-0 Monocryl Monocryl was then used to close the skin in a subcuticular stitch. Dermabond is applied.  Antibiotic ointment and a sterile dressing is applied to the exit site. Patient tolerated procedure well and there were no complications.  COMPLICATIONS: None  CONDITION: Unchanged  Dakota Estes, Dolores Lory. [facility] Office: [phone]  03/14/2015,1:07 PM

## 2015-08-21 ENCOUNTER — Other Ambulatory Visit: Payer: Self-pay | Admitting: *Deleted

## 2015-08-21 NOTE — Patient Outreach (Addendum)
[facility]) Care Management  08/21/2015  Dakota Estes 1940-11-02 [id number]   Subjective: Telephone call to patient's home number times 2, spoke with patient, and HIPAA verified.   Patient states he is doing well.   Discussed [facility] Care Management services and patient in agreement to complete telephone screen. Patient states he has a history of diabetes, Rheumatoid arthritis and he denies having hypertension.  States he is in the process of obtaining a new blood pressure cuff.   States he has a glucometer, rolling walker, straight cane, and elevated toilet seat.   Patient states his primary MD is no longer Dr. Glendon Axe and is now Dr. Valere Dross in [place] advised patient to call [facility] customer service and request that primary MD be updated to Dr. Shellia Carwin.  Patient voices understanding, states he has been trying to get [employer] to update his primary MD in their system for over a year, and today he will follow up again to request update.   RNCM advised patient he is not eligible for [facility] Care Management services since he does not have a [facility] provider and patient voices understanding. Patient states he does not have any care coordination, disease management, disease monitoring, pharmacy, community resource, or transportation needs at this time.    Objective:  Per chart review: Patient hospitalized 02/01/15 - 02/13/15 for sepsis.   Patient had dialysis catheter removed on 03/14/15.     Assessment: Received [facility] Referral on 08/16/15.   Referral source: Jettie Booze.   Referral reason: [place] diagnosis choloangitis, length of stay 4, diabetes no complications, disease, and symptom management.  Date of last discharge: 08/08/15.    Plan: RNCM will send case closure due to ineligible provider request to [facility].   Larae Caison H. Annia Friendly, BSN, [facility] Telephonic CM Phone: [phone] Fax: [phone]

## 2015-11-15 DEATH — deceased

## 2017-04-18 IMAGING — US US RENAL
1 series · 14 of 25 positions shown · non-contrast
Comparison: 03/18/2011 CT

CLINICAL DATA: Acute kidney injury. Patient reports he was born
with only left kidney.

EXAM:
RENAL / URINARY TRACT ULTRASOUND COMPLETE

[Series 1: us renal · 0.28mm/px · 14 of 163 slices shown]
[im 1/163]
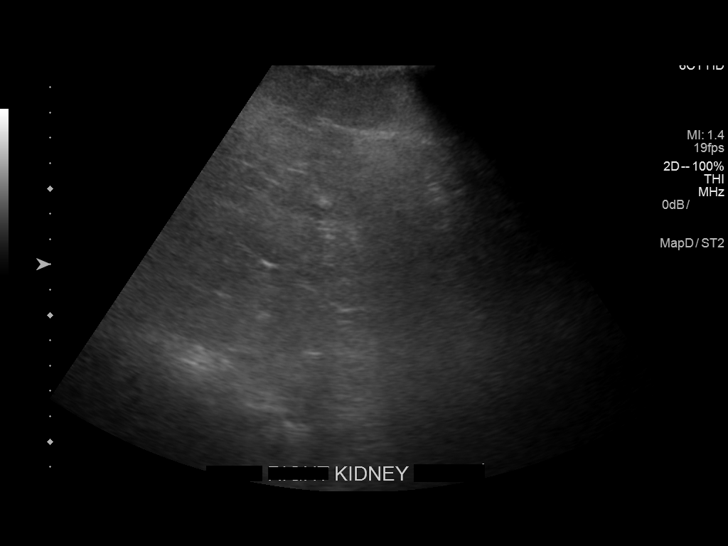
[im 14/163]
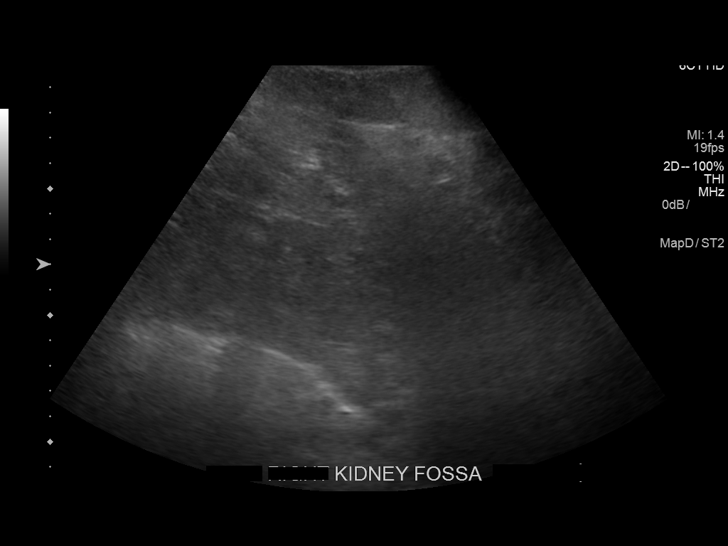
[im 28/163]
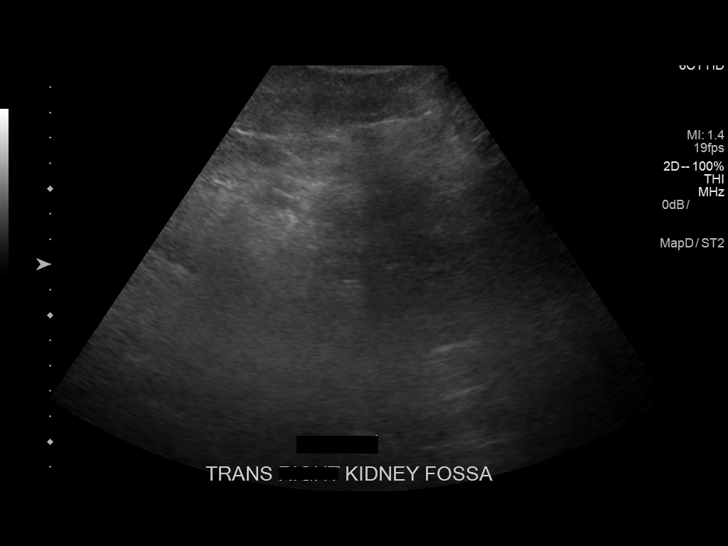
[im 41/163]
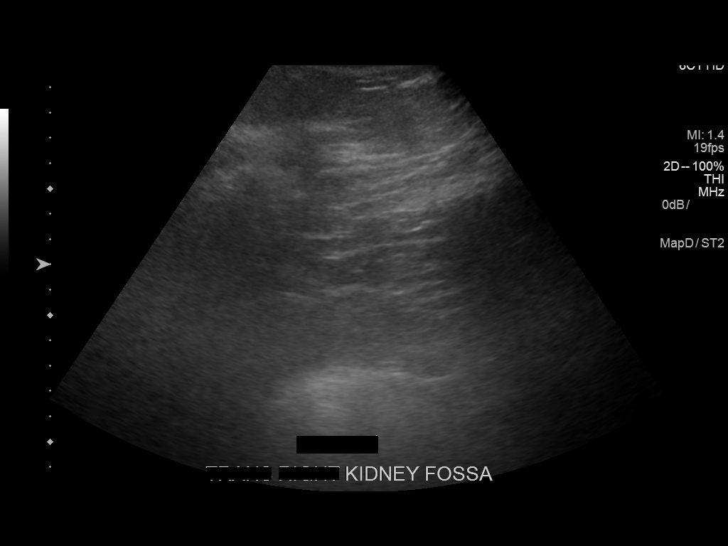
[im 55/163]
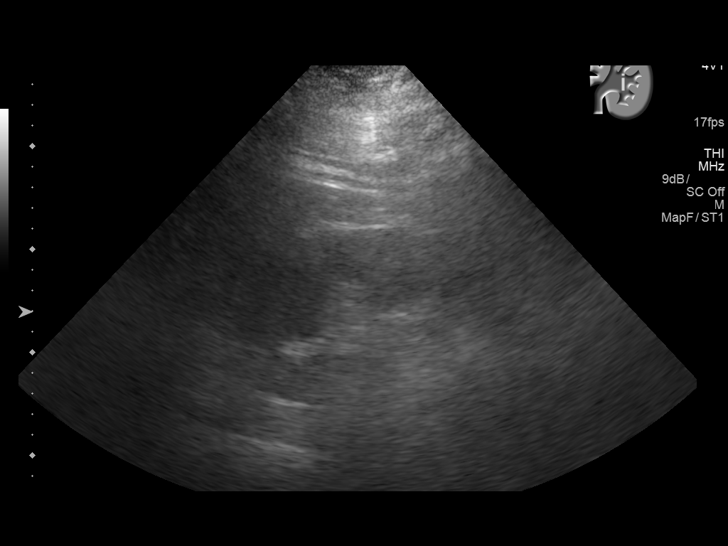
[im 61/163]
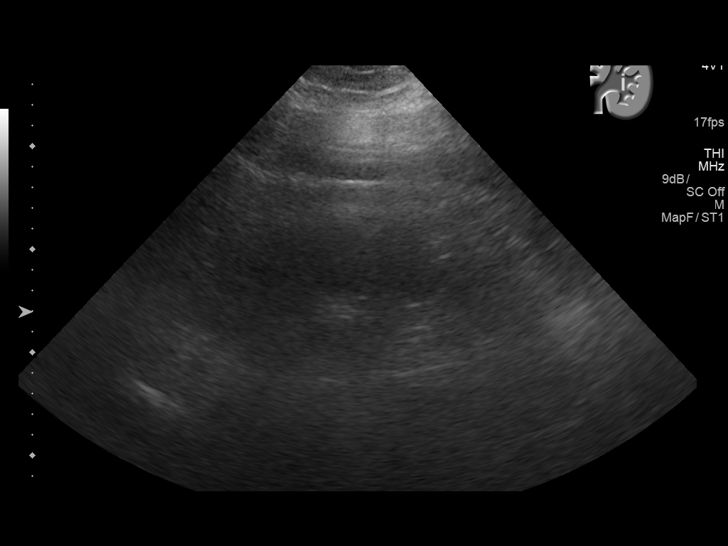
[im 75/163]
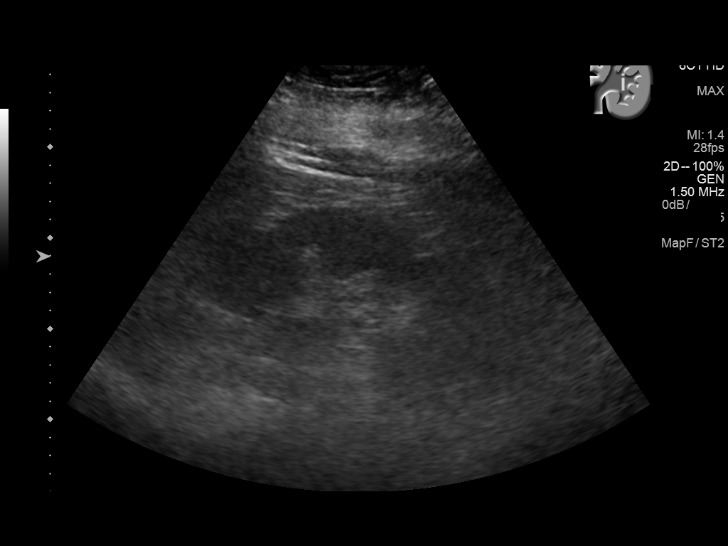
[im 88/163]
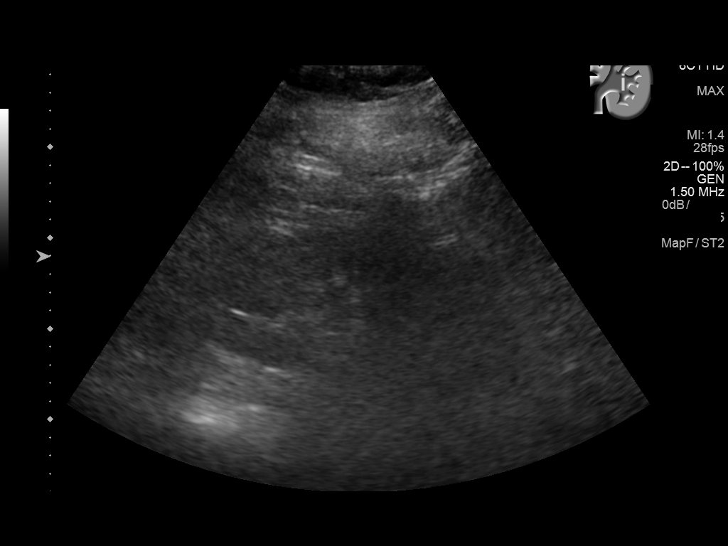
[im 102/163]
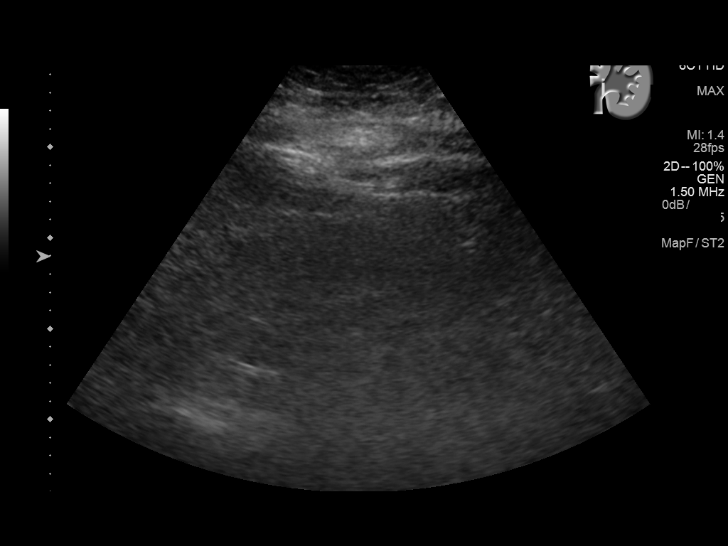
[im 109/163]
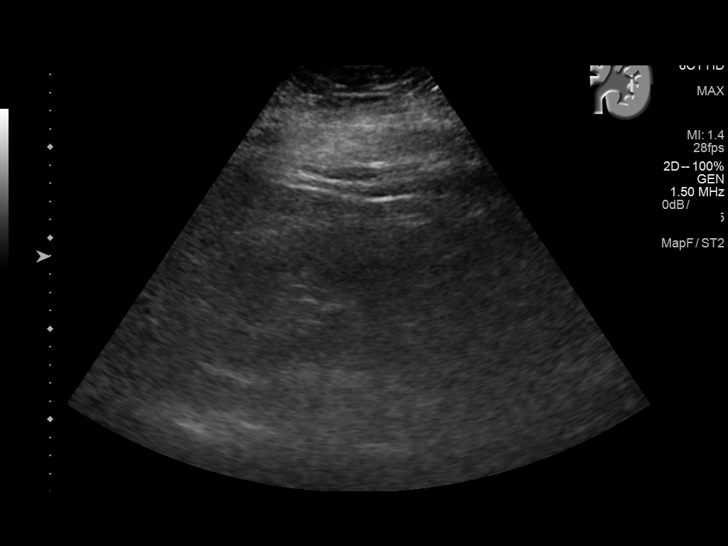
[im 122/163]
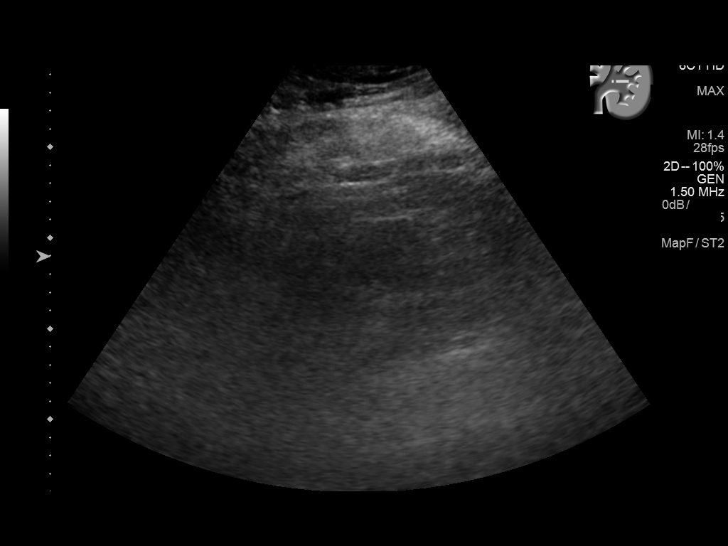
[im 136/163]
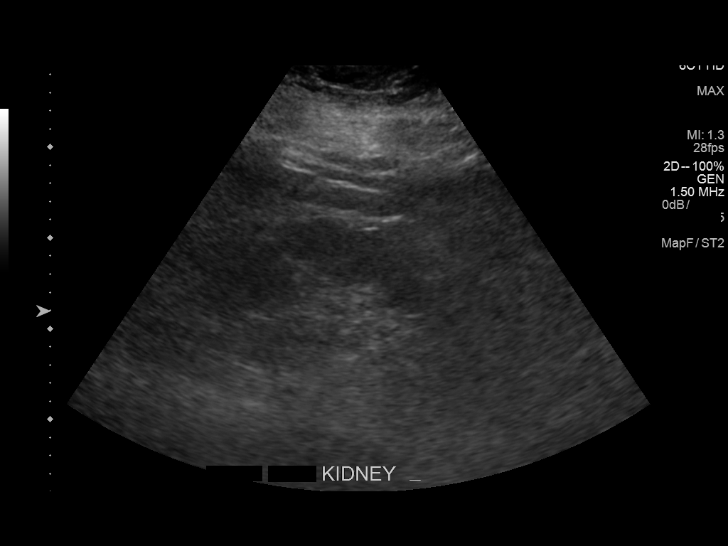
[im 149/163]
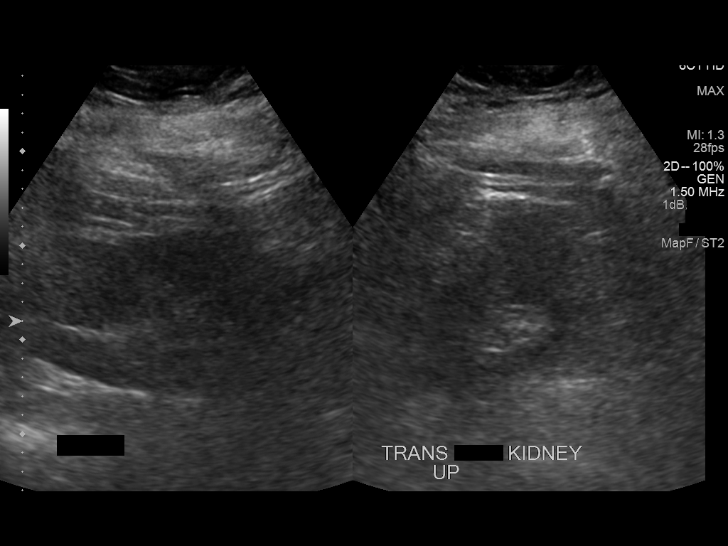
[im 163/163]
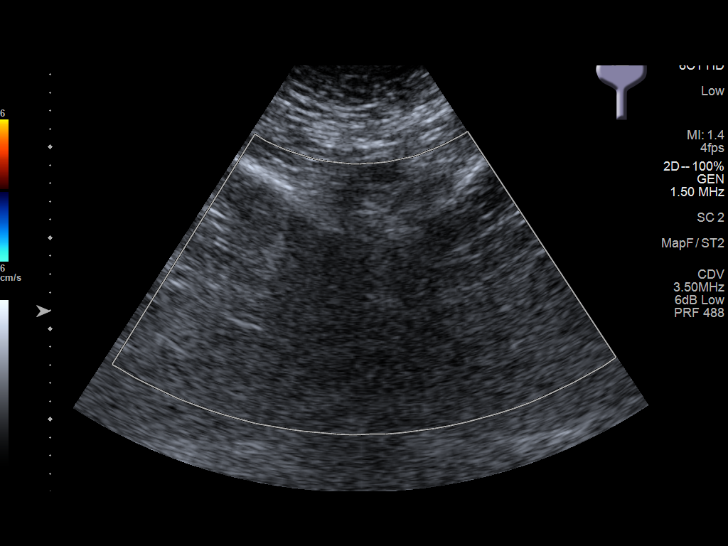

[14 of 25 positions shown; findings below may reference images not displayed]

FINDINGS: Right Kidney:

Length: Not visualized. Diminutive right kidney identified by
previous CT is not visualized today.

Left Kidney:

Length: 16.8 cm. Echogenicity is normal. No hydronephrosis
identified. Upper pole hypoechoic mass is 5.0 x 6.2 x 4.7 cm.
Findings are consistent with benign, stable cyst compared with
previous CT exam.

Bladder:

Bladder is decompressed by a Foley catheter, limiting evaluation of
the bladder.

Additional:

Study quality is degraded by patient body habitus.
IMPRESSION: 1. Nonvisualized right kidney, known to be diminutive based on prior
CT exam.
2. No hydronephrosis on the left.
3. Upper pole left renal cyst.

## 2017-06-27 IMAGING — CR DG CHEST 1V
1 series · 1 of 1 positions shown · non-contrast
Comparison: 02/01/2015

CLINICAL DATA: Central line placement

EXAM:
CHEST  1 VIEW

[ap]
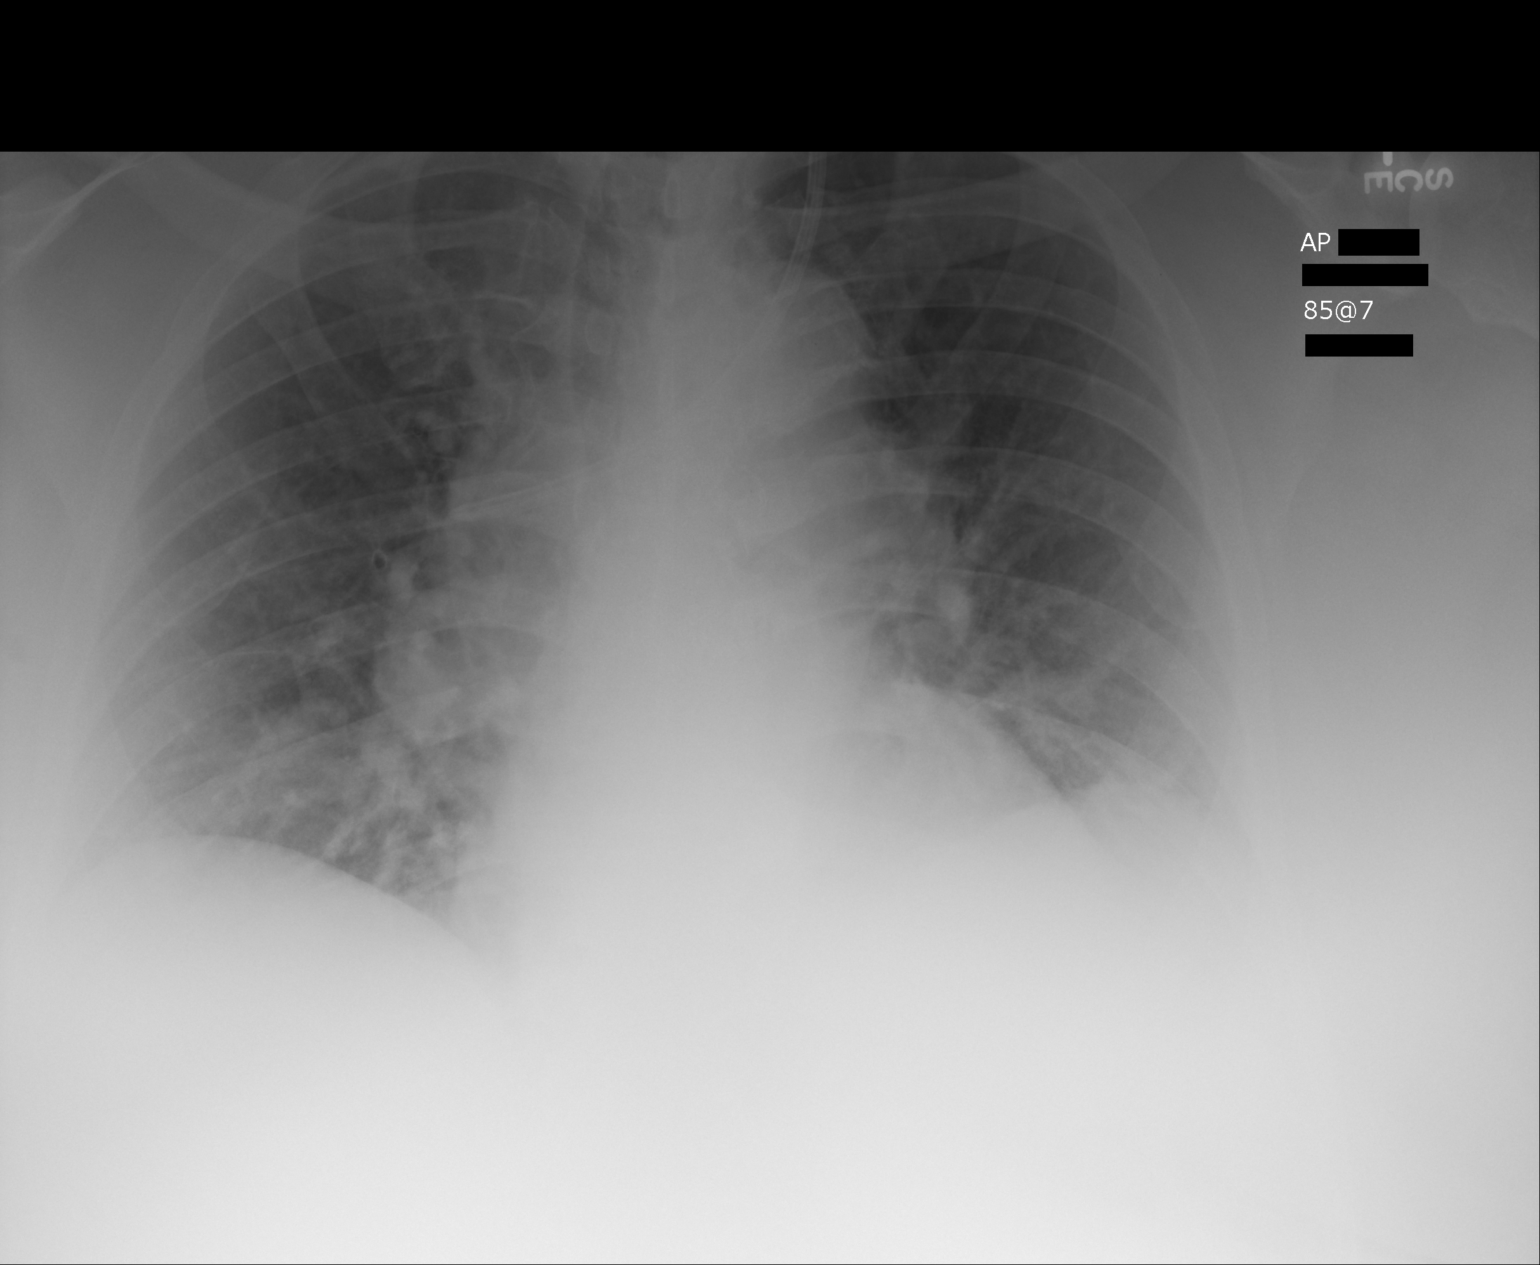

[1 of 1 positions shown; findings below may reference images not displayed]

FINDINGS: Cardiac shadow is stable. Central vascular congestion is noted. No
focal infiltrate is seen. A left jugular central line is noted with
the catheter tip in the proximal superior vena cava. No pneumothorax
is noted.
IMPRESSION: Status post central line placement as described above. No
pneumothorax is seen. Increased vascular congestion is noted
centrally.
